# Patient Record
Sex: Male | Born: 1960 | Race: White | Hispanic: No | Marital: Married | Smoking: Never smoker
Health system: Southern US, Community
[De-identification: ages and names within clinical notes are randomized; demographics above are authoritative.]

## PROBLEM LIST (undated history)

## (undated) DIAGNOSIS — I10 Essential (primary) hypertension: Secondary | ICD-10-CM

## (undated) DIAGNOSIS — I639 Cerebral infarction, unspecified: Secondary | ICD-10-CM

## (undated) HISTORY — PX: HERNIA REPAIR: SHX51

## (undated) HISTORY — PX: OTHER SURGICAL HISTORY: SHX169

---

## 2010-03-21 ENCOUNTER — Ambulatory Visit: Payer: Self-pay | Admitting: Surgery

## 2010-03-27 ENCOUNTER — Ambulatory Visit: Payer: Self-pay | Admitting: Surgery

## 2017-03-08 ENCOUNTER — Other Ambulatory Visit: Payer: Self-pay | Admitting: Internal Medicine

## 2017-03-08 DIAGNOSIS — S060X0A Concussion without loss of consciousness, initial encounter: Secondary | ICD-10-CM

## 2017-03-09 ENCOUNTER — Other Ambulatory Visit: Payer: Self-pay | Admitting: Internal Medicine

## 2017-03-09 DIAGNOSIS — S060X0A Concussion without loss of consciousness, initial encounter: Secondary | ICD-10-CM

## 2017-03-10 ENCOUNTER — Other Ambulatory Visit: Payer: Self-pay | Admitting: Internal Medicine

## 2017-03-10 DIAGNOSIS — M25512 Pain in left shoulder: Secondary | ICD-10-CM

## 2017-03-10 DIAGNOSIS — S060X0A Concussion without loss of consciousness, initial encounter: Secondary | ICD-10-CM

## 2017-03-10 DIAGNOSIS — M542 Cervicalgia: Secondary | ICD-10-CM

## 2017-03-10 DIAGNOSIS — M25511 Pain in right shoulder: Secondary | ICD-10-CM

## 2017-03-11 ENCOUNTER — Ambulatory Visit: Payer: BLUE CROSS/BLUE SHIELD

## 2017-03-18 ENCOUNTER — Ambulatory Visit
Admission: RE | Admit: 2017-03-18 | Discharge: 2017-03-18 | Disposition: A | Discharge status: Home / Self Care | Payer: BLUE CROSS/BLUE SHIELD | Source: Ambulatory Visit | Attending: Internal Medicine | Admitting: Internal Medicine

## 2017-03-18 DIAGNOSIS — S060X0A Concussion without loss of consciousness, initial encounter: Secondary | ICD-10-CM | POA: Diagnosis not present

## 2017-03-18 DIAGNOSIS — X58XXXA Exposure to other specified factors, initial encounter: Secondary | ICD-10-CM | POA: Insufficient documentation

## 2019-08-30 ENCOUNTER — Other Ambulatory Visit: Payer: Self-pay

## 2019-08-30 ENCOUNTER — Encounter: Payer: Self-pay | Admitting: Emergency Medicine

## 2019-08-30 ENCOUNTER — Emergency Department
Admission: EM | Admit: 2019-08-30 | Discharge: 2019-08-30 | Disposition: A | Discharge status: Home / Self Care | Payer: 59 | Attending: Emergency Medicine | Admitting: Emergency Medicine

## 2019-08-30 ENCOUNTER — Emergency Department: Payer: 59

## 2019-08-30 DIAGNOSIS — K5792 Diverticulitis of intestine, part unspecified, without perforation or abscess without bleeding: Secondary | ICD-10-CM | POA: Insufficient documentation

## 2019-08-30 DIAGNOSIS — R109 Unspecified abdominal pain: Secondary | ICD-10-CM | POA: Diagnosis present

## 2019-08-30 LAB — COMPREHENSIVE METABOLIC PANEL
ALT: 20 U/L (ref 0–44)
AST: 18 U/L (ref 15–41)
Albumin: 4.2 g/dL (ref 3.5–5.0)
Alkaline Phosphatase: 65 U/L (ref 38–126)
Anion gap: 11 (ref 5–15)
BUN: 18 mg/dL (ref 6–20)
CO2: 25 mmol/L (ref 22–32)
Calcium: 9.1 mg/dL (ref 8.9–10.3)
Chloride: 101 mmol/L (ref 98–111)
Creatinine, Ser: 1.16 mg/dL (ref 0.61–1.24)
GFR calc Af Amer: >60 mL/min (ref >60)
GFR calc non Af Amer: >60 mL/min (ref >60)
Glucose, Bld: 119 mg/dL — ABNORMAL HIGH (ref 70–99)
Potassium: 3.9 mmol/L (ref 3.5–5.1)
Sodium: 137 mmol/L (ref 135–145)
Total Bilirubin: 1.1 mg/dL (ref 0.3–1.2)
Total Protein: 7.9 g/dL (ref 6.5–8.1)

## 2019-08-30 LAB — CBC WITH DIFFERENTIAL/PLATELET
Abs Immature Granulocytes: 0.06 10*3/uL (ref 0.00–0.07)
Basophils Absolute: 0 10*3/uL (ref 0.0–0.1)
Basophils Relative: 0 %
Eosinophils Absolute: 0 10*3/uL (ref 0.0–0.5)
Eosinophils Relative: 0 %
HCT: 44.2 % (ref 39.0–52.0)
Hemoglobin: 15.1 g/dL (ref 13.0–17.0)
Immature Granulocytes: 0 %
Lymphocytes Relative: 9 %
Lymphs Abs: 1.3 10*3/uL (ref 0.7–4.0)
MCH: 31.3 pg (ref 26.0–34.0)
MCHC: 34.2 g/dL (ref 30.0–36.0)
MCV: 91.5 fL (ref 80.0–100.0)
Monocytes Absolute: 1.4 10*3/uL — ABNORMAL HIGH (ref 0.1–1.0)
Monocytes Relative: 10 %
Neutro Abs: 11 10*3/uL — ABNORMAL HIGH (ref 1.7–7.7)
Neutrophils Relative %: 81 %
Platelets: 244 10*3/uL (ref 150–400)
RBC: 4.83 MIL/uL (ref 4.22–5.81)
RDW: 11.6 % (ref 11.5–15.5)
WBC: 13.8 10*3/uL — ABNORMAL HIGH (ref 4.0–10.5)
nRBC: 0 % (ref 0.0–0.2)

## 2019-08-30 LAB — URINALYSIS, COMPLETE (UACMP) WITH MICROSCOPIC
Bacteria, UA: NONE SEEN
Bilirubin Urine: NEGATIVE
Glucose, UA: NEGATIVE mg/dL
Hgb urine dipstick: NEGATIVE
Ketones, ur: NEGATIVE mg/dL
Leukocytes,Ua: NEGATIVE
Nitrite: NEGATIVE
Protein, ur: NEGATIVE mg/dL
Specific Gravity, Urine: 1.018 (ref 1.005–1.030)
Squamous Epithelial / HPF: NONE SEEN (ref 0–5)
pH: 5 (ref 5.0–8.0)

## 2019-08-30 LAB — LIPASE, BLOOD: Lipase: 51 U/L (ref 11–51)

## 2019-08-30 MED ORDER — PIPERACILLIN-TAZOBACTAM 3.375 G IVPB 30 MIN
3.3750 g | Freq: Once | INTRAVENOUS | Status: AC
  Administered 2019-08-30: 3.375 g via INTRAVENOUS
  Filled 2019-08-30: qty 50

## 2019-08-30 MED ORDER — MORPHINE SULFATE (PF) 4 MG/ML IV SOLN
4.0000 mg | Freq: Once | INTRAVENOUS | Status: AC
  Administered 2019-08-30: 4 mg via INTRAVENOUS
  Filled 2019-08-30: qty 1

## 2019-08-30 MED ORDER — CIPROFLOXACIN HCL 500 MG PO TABS: 500.0000 mg | ORAL_TABLET | Freq: Two times a day (BID) | ORAL | 0 refills | Status: AC

## 2019-08-30 MED ORDER — OXYCODONE-ACETAMINOPHEN 5-325 MG PO TABS: 1.0000 | ORAL_TABLET | Freq: Two times a day (BID) | ORAL | 0 refills | Status: DC | PRN

## 2019-08-30 MED ORDER — METRONIDAZOLE 500 MG PO TABS: 500.0000 mg | ORAL_TABLET | Freq: Three times a day (TID) | ORAL | 0 refills | Status: AC

## 2019-08-30 NOTE — ED Provider Notes (Signed)
[facility] Emergency Department Provider Note       Time seen: ----------------------------------------- 5:12 PM on 08/30/2019 -----------------------------------------  I have reviewed the triage vital signs and the nursing notes.  HISTORY   Chief Complaint No chief complaint on file.   HPI Eric Austin is a 59 y.o. male with no known past medical history who presents to the ED for severe right flank pain.  Patient describes it as dull, has not had vomiting or diarrhea.  Patient reports no history of kidney stones, reports he only has one kidney on the right side.  He was chopping wood recently but does not feel like he was overexerting himself.  No past medical history on file.  There are no problems to display for this patient.  Allergies Patient has no allergy information on record.  Social History Social History   Tobacco Use  . Smoking status: Not on file  Substance Use Topics  . Alcohol use: Not on file  . Drug use: Not on file    Review of Systems Constitutional: Negative for fever. Cardiovascular: Negative for chest pain. Respiratory: Negative for shortness of breath. Gastrointestinal: Positive for flank pain Musculoskeletal: Negative for back pain. Skin: Negative for rash. Neurological: Negative for headaches, focal weakness or numbness.  All systems negative/normal/unremarkable except as stated in the HPI  ____________________________________________   PHYSICAL EXAM:  VITAL SIGNS: ED Triage Vitals  Enc Vitals Group     BP      Pulse      Resp      Temp      Temp src      SpO2      Weight      Height      Head Circumference      Peak Flow      Pain Score      Pain Loc      Pain Edu?      Excl. in [place]?     Constitutional: Alert and oriented. Well appearing and in no distress. Eyes: Conjunctivae are normal. Normal extraocular movements. Cardiovascular: Normal rate, regular rhythm. No murmurs, rubs, or  gallops. Respiratory: Normal respiratory effort without tachypnea nor retractions. Breath sounds are clear and equal bilaterally. No wheezes/rales/rhonchi. Gastrointestinal: Right flank tenderness, no rebound or guarding.  Normal bowel sounds. Musculoskeletal: Nontender with normal range of motion in extremities. No lower extremity tenderness nor edema. Neurologic:  Normal speech and language. No gross focal neurologic deficits are appreciated.  Skin:  Skin is warm, dry and intact. No rash noted. Psychiatric: Mood and affect are normal. Speech and behavior are normal.  ____________________________________________  ED COURSE:  As part of my medical decision making, I reviewed the following data within the [facility] [place] History obtained from family if available, nursing notes, old chart and ekg, as well as notes from prior ED visits. Patient presented for flank pain, we will assess with labs and imaging as indicated at this time.   Procedures  Eric Austin was evaluated in Emergency Department on 08/30/2019 for the symptoms described in the history of present illness. He was evaluated in the context of the global COVID-19 pandemic, which necessitated consideration that the patient might be at risk for infection with the SARS-CoV-2 virus that causes COVID-19. Institutional protocols and algorithms that pertain to the evaluation of patients at risk for COVID-19 are in a state of rapid change based on information released by regulatory bodies including the CDC and federal and state  organizations. These policies and algorithms were followed during the patient's care in the ED.  ____________________________________________   LABS (pertinent positives/negatives)  Labs Reviewed  CBC WITH DIFFERENTIAL/PLATELET - Abnormal; Notable for the following components:      Result Value   WBC 13.8 (*)    Neutro Abs 11.0 (*)    Monocytes Absolute 1.4 (*)    All other components within normal  limits  COMPREHENSIVE METABOLIC PANEL - Abnormal; Notable for the following components:   Glucose, Bld 119 (*)    All other components within normal limits  URINALYSIS, COMPLETE (UACMP) WITH MICROSCOPIC - Abnormal; Notable for the following components:   Color, Urine YELLOW (*)    APPearance CLEAR (*)    All other components within normal limits  LIPASE, BLOOD    RADIOLOGY Images were viewed by me  CT renal protocol  IMPRESSION:  1. Negative for right hydronephrosis or ureteral stone.  2. Focal wall thickening with moderate inflammatory changes and  small amount of fluid at the ascending colon. Diverticular disease  is present and findings are suspected to represent acute right-sided  diverticulitis. No perforation or abscess. Appendix is normal.  3. 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  4. 3 mm left upper lobe pulmonary nodule. No follow-up needed if  patient is low-risk. Non-contrast chest CT can be considered in 12  months if patient is high-risk. This recommendation follows the  consensus statement: Guidelines for Management of Incidental  Pulmonary Nodules Detected on CT Images: From the Fleischner Society  2017; Radiology 2017; 284:228-243.  ____________________________________________   DIFFERENTIAL DIAGNOSIS   Renal colic, UTI, pyelonephritis, muscle strain, rib fracture  FINAL ASSESSMENT AND PLAN  Flank pain, acute ascending: Diverticulitis   Plan: The patient had presented for right flank pain. Patient's labs did reveal leukocytosis without other process. Patient's imaging revealed a surprising amount of inflammatory changes in the ascending colon consistent with diverticulitis.  There is no obvious perforation or abscess.  He received IV Zosyn and morphine for pain.  He will be discharged with antibiotics, pain medicine and close outpatient follow-up with his doctor.   Laurence Aly, MD    Note: This note was generated in part or  whole with voice recognition software. Voice recognition is usually quite accurate but there are transcription errors that can and very often do occur. I apologize for any typographical errors that were not detected and corrected.     Earleen Newport, MD 08/30/19 1924

## 2019-08-30 NOTE — ED Triage Notes (Signed)
Pt arrives from home via [facility] c/o rt flank abdominal pain that started last night. Pt was chopping wood today and the pain got so bad wife called EMS.

## 2020-05-14 ENCOUNTER — Other Ambulatory Visit: Payer: Self-pay

## 2020-05-14 ENCOUNTER — Ambulatory Visit (INDEPENDENT_AMBULATORY_CARE_PROVIDER_SITE_OTHER): Payer: 59 | Admitting: Internal Medicine

## 2020-05-14 DIAGNOSIS — Z0000 Encounter for general adult medical examination without abnormal findings: Secondary | ICD-10-CM

## 2020-05-15 ENCOUNTER — Ambulatory Visit (INDEPENDENT_AMBULATORY_CARE_PROVIDER_SITE_OTHER): Payer: 59 | Admitting: Internal Medicine

## 2020-05-15 ENCOUNTER — Encounter: Payer: Self-pay | Admitting: Internal Medicine

## 2020-05-15 ENCOUNTER — Other Ambulatory Visit: Payer: Self-pay

## 2020-05-15 VITALS — BP 134/81 | HR 82 | Ht 66.0 in | Wt 169.1 lb

## 2020-05-15 DIAGNOSIS — Z0000 Encounter for general adult medical examination without abnormal findings: Secondary | ICD-10-CM | POA: Diagnosis not present

## 2020-05-15 DIAGNOSIS — N400 Enlarged prostate without lower urinary tract symptoms: Secondary | ICD-10-CM | POA: Diagnosis not present

## 2020-05-15 DIAGNOSIS — K580 Irritable bowel syndrome with diarrhea: Secondary | ICD-10-CM | POA: Insufficient documentation

## 2020-05-15 DIAGNOSIS — M1711 Unilateral primary osteoarthritis, right knee: Secondary | ICD-10-CM

## 2020-05-15 LAB — LIPID PANEL
Cholesterol: 220 mg/dL — ABNORMAL HIGH (ref <200)
HDL: 39 mg/dL — ABNORMAL LOW (ref >=40)
LDL Cholesterol (Calc): 150 mg/dL (calc) — ABNORMAL HIGH
Non-HDL Cholesterol (Calc): 181 mg/dL (calc) — ABNORMAL HIGH (ref <130)
Total CHOL/HDL Ratio: 5.6 (calc) — ABNORMAL HIGH (ref <5.0)
Triglycerides: 172 mg/dL — ABNORMAL HIGH (ref <150)

## 2020-05-15 LAB — COMPLETE METABOLIC PANEL WITH GFR
AG Ratio: 1.5 (calc) (ref 1.0–2.5)
ALT: 39 U/L (ref 9–46)
AST: 32 U/L (ref 10–35)
Albumin: 4.1 g/dL (ref 3.6–5.1)
Alkaline phosphatase (APISO): 70 U/L (ref 35–144)
BUN: 12 mg/dL (ref 7–25)
CO2: 24 mmol/L (ref 20–32)
Calcium: 9.3 mg/dL (ref 8.6–10.3)
Chloride: 105 mmol/L (ref 98–110)
Creat: 1.28 mg/dL (ref 0.70–1.33)
GFR, Est African American: 71 mL/min/{1.73_m2} (ref >=60)
GFR, Est Non African American: 61 mL/min/{1.73_m2} (ref >=60)
Globulin: 2.8 g/dL (calc) (ref 1.9–3.7)
Glucose, Bld: 98 mg/dL (ref 65–99)
Potassium: 3.8 mmol/L (ref 3.5–5.3)
Sodium: 140 mmol/L (ref 135–146)
Total Bilirubin: 0.9 mg/dL (ref 0.2–1.2)
Total Protein: 6.9 g/dL (ref 6.1–8.1)

## 2020-05-15 LAB — CBC WITH DIFFERENTIAL/PLATELET
Absolute Monocytes: 560 cells/uL (ref 200–950)
Basophils Absolute: 32 cells/uL (ref 0–200)
Basophils Relative: 0.8 %
Eosinophils Absolute: 32 cells/uL (ref 15–500)
Eosinophils Relative: 0.8 %
HCT: 42.5 % (ref 38.5–50.0)
Hemoglobin: 14.9 g/dL (ref 13.2–17.1)
Lymphs Abs: 1552 cells/uL (ref 850–3900)
MCH: 32.3 pg (ref 27.0–33.0)
MCHC: 35.1 g/dL (ref 32.0–36.0)
MCV: 92 fL (ref 80.0–100.0)
MPV: 10.9 fL (ref 7.5–12.5)
Monocytes Relative: 14 %
Neutro Abs: 1824 cells/uL (ref 1500–7800)
Neutrophils Relative %: 45.6 %
Platelets: 218 10*3/uL (ref 140–400)
RBC: 4.62 10*6/uL (ref 4.20–5.80)
RDW: 11.4 % (ref 11.0–15.0)
Total Lymphocyte: 38.8 %
WBC: 4 10*3/uL (ref 3.8–10.8)

## 2020-05-15 LAB — PSA: PSA: 1.09 ng/mL (ref <=4.0)

## 2020-05-15 LAB — TSH: TSH: 2.45 mIU/L (ref 0.40–4.50)

## 2020-05-15 NOTE — Progress Notes (Signed)
Established Patient Office Visit  Subjective:  Patient ID: Eric Austin, male    DOB: 16-Dec-1960  Age: 59 y.o. MRN: [id number]  CC:  Chief Complaint  Patient presents with  . Annual Exam    Diarrhea  This is a chronic problem. The current episode started more than 1 year ago. The problem has been waxing and waning. The stool consistency is described as watery. Associated symptoms include abdominal pain, arthralgias and sweats. Pertinent negatives include no bloating, chills, coughing, fever, headaches, increased  flatus, myalgias, URI, vomiting or weight loss. The symptoms are aggravated by dairy products and stress. He has tried nothing for the symptoms. His past medical history is significant for irritable bowel syndrome. There is no history of bowel resection, inflammatory bowel disease, a recent abdominal surgery or short gut syndrome.  Knee Pain  Pertinent negatives include no numbness.  Complain of pain in the right knee for the last 1 to 5 months.  No history of any injury.  [name] presents for physical     History reviewed. No pertinent past medical history.  History reviewed. No pertinent surgical history.  History reviewed. No pertinent family history.  Social History   Socioeconomic History  . Marital status: Married    Spouse name: Not on file  . Number of children: Not on file  . Years of education: Not on file  . Highest education level: Not on file  Occupational History  . Not on file  Tobacco Use  . Smoking status: Never Smoker  . Smokeless tobacco: Never Used  Vaping Use  . Vaping Use: Never used  Substance and Sexual Activity  . Alcohol use: Yes    Comment: 11 drinks per week   . Drug use: Never  . Sexual activity: Not on file  Other Topics Concern  . Not on file  Social History Narrative  . Not on file   Social Determinants of Health   Financial Resource Strain:   . Difficulty of Paying Living Expenses: Not on file  Food Insecurity:     . Worried About Charity fundraiser in the Last Year: Not on file  . Ran Out of Food in the Last Year: Not on file  Transportation Needs:   . Lack of Transportation (Medical): Not on file  . Lack of Transportation (Non-Medical): Not on file  Physical Activity:   . Days of Exercise per Week: Not on file  . Minutes of Exercise per Session: Not on file  Stress:   . Feeling of Stress : Not on file  Social Connections:   . Frequency of Communication with Friends and Family: Not on file  . Frequency of Social Gatherings with Friends and Family: Not on file  . Attends Religious Services: Not on file  . Active Member of Clubs or Organizations: Not on file  . Attends Archivist Meetings: Not on file  . Marital Status: Not on file  Intimate Partner Violence:   . Fear of Current or Ex-Partner: Not on file  . Emotionally Abused: Not on file  . Physically Abused: Not on file  . Sexually Abused: Not on file    No current outpatient medications on file.   Not on File  ROS Review of Systems  Constitutional: Negative.  Negative for chills, fever and weight loss.  HENT: Negative.   Eyes: Negative.  Negative for discharge.  Respiratory: Negative.  Negative for cough.   Cardiovascular: Negative.   Gastrointestinal: Positive for  abdominal pain and diarrhea. Negative for bloating, flatus and vomiting.  Endocrine: Negative.  Negative for polyphagia and polyuria.  Genitourinary: Negative.  Negative for frequency and hematuria.  Musculoskeletal: Positive for arthralgias. Negative for myalgias.  Skin: Negative.   Allergic/Immunologic: Negative.   Neurological: Negative.  Negative for dizziness, numbness and headaches.  Hematological: Negative.  Negative for adenopathy.  Psychiatric/Behavioral: Negative.  Negative for agitation. The patient is not hyperactive.   All other systems reviewed and are negative.     Objective:    Physical Exam Vitals reviewed.  Constitutional:       Appearance: Normal appearance.  HENT:     Mouth/Throat:     Mouth: Mucous membranes are moist.  Eyes:     Pupils: Pupils are equal, round, and reactive to light.  Neck:     Vascular: No carotid bruit.  Cardiovascular:     Rate and Rhythm: Normal rate and regular rhythm.     Pulses: Normal pulses.     Heart sounds: Normal heart sounds.  Pulmonary:     Effort: Pulmonary effort is normal.     Breath sounds: Normal breath sounds.  Abdominal:     General: Bowel sounds are normal.     Palpations: Abdomen is soft. There is no hepatomegaly, splenomegaly or mass.     Tenderness: There is no abdominal tenderness.     Hernia: No hernia is present.  Genitourinary:    Prostate: Normal.     Rectum: Normal. Guaiac result negative.  Musculoskeletal:        General: No swelling.     Cervical back: Neck supple.     Right lower leg: No edema.     Left lower leg: No edema.  Skin:    Findings: No rash.  Neurological:     Mental Status: He is alert and oriented to person, place, and time.     Motor: No weakness.  Psychiatric:        Mood and Affect: Mood normal.        Behavior: Behavior normal.     BP 134/81   Pulse 82   Ht 5\' 6"  (1.676 m)   Wt 169 lb 1.6 oz (76.7 kg)   BMI 27.29 kg/m  Wt Readings from Last 3 Encounters:  05/15/20 169 lb 1.6 oz (76.7 kg)  08/30/19 165 lb (74.8 kg)     Health Maintenance Due  Topic Date Due  . Hepatitis C Screening  Never done  . HIV Screening  Never done  . TETANUS/TDAP  Never done  . COLONOSCOPY  Never done  . INFLUENZA VACCINE  Never done    There are no preventive care reminders to display for this patient.  Lab Results  Component Value Date   TSH 2.45 05/14/2020   Lab Results  Component Value Date   WBC 4.0 05/14/2020   HGB 14.9 05/14/2020   HCT 42.5 05/14/2020   MCV 92.0 05/14/2020   PLT 218 05/14/2020   Lab Results  Component Value Date   NA 140 05/14/2020   K 3.8 05/14/2020   CO2 24 05/14/2020   GLUCOSE 98 05/14/2020     BUN 12 05/14/2020   CREATININE 1.28 05/14/2020   BILITOT 0.9 05/14/2020   ALKPHOS 65 08/30/2019   AST 32 05/14/2020   ALT 39 05/14/2020   PROT 6.9 05/14/2020   ALBUMIN 4.2 08/30/2019   CALCIUM 9.3 05/14/2020   ANIONGAP 11 08/30/2019   Lab Results  Component Value Date   CHOL 220 (  H) 05/14/2020   Lab Results  Component Value Date   HDL 39 (L) 05/14/2020   Lab Results  Component Value Date   LDLCALC 150 (H) 05/14/2020   Lab Results  Component Value Date   TRIG 172 (H) 05/14/2020   Lab Results  Component Value Date   CHOLHDL 5.6 (H) 05/14/2020   No results found for: HGBA1C    Assessment & Plan:   Problem List Items Addressed This Visit      Digestive   Irritable bowel syndrome with diarrhea    Patient complaining of intermittent diarrhea.  I did a rectal examination which was unremarkable with no blood in stool.  Prostate is borderline enlarged.  We will send the patient to gastroenterology he is due for colonoscopy.        Musculoskeletal and Integument   Primary osteoarthritis of right knee    Patient has primary osteoarthritis of the right knee.  We will get an x-ray.  If he needed a steroid shot he will come back after the x-ray was done.        Genitourinary   BPH without obstruction/lower urinary tract symptoms    Patient prostate is slightly enlarged by physical examination.  His PSA is normal.        Other   Annual physical exam - Primary    Patient general physical examination is normal.  He has high cholesterol and he was advised to follow low-cholesterol diet.  Blood pressure is stable.         No orders of the defined types were placed in this encounter.   Follow-up    Patient main problem on today's examination was found to have right knee pain I will get an x-ray of the right knee and give him a cortisone shot if needed.  I will send him to gastroenterologist for colonoscopy.  He will also look on the irritable bowel syndrome and  try to find out the etiology of the patient diarrhea. Patient cholesterol is elevated she was advised to follow low-cholesterol diet. Cletis Athens, MD

## 2020-05-15 NOTE — Assessment & Plan Note (Signed)
Patient general physical examination is normal.  He has high cholesterol and he was advised to follow low-cholesterol diet.  Blood pressure is stable.

## 2020-05-15 NOTE — Assessment & Plan Note (Signed)
Patient has primary osteoarthritis of the right knee.  We will get an x-ray.  If he needed a steroid shot he will come back after the x-ray was done.

## 2020-05-15 NOTE — Assessment & Plan Note (Signed)
Patient complaining of intermittent diarrhea.  I did a rectal examination which was unremarkable with no blood in stool.  Prostate is borderline enlarged.  We will send the patient to gastroenterology he is due for colonoscopy.

## 2020-05-15 NOTE — Assessment & Plan Note (Signed)
Patient prostate is slightly enlarged by physical examination.  His PSA is normal.

## 2020-05-20 NOTE — Addendum Note (Signed)
Addended by: Alois Cliche on: 05/20/2020 03:26 PM   Modules accepted: Orders

## 2020-06-11 ENCOUNTER — Encounter: Payer: Self-pay | Admitting: Gastroenterology

## 2020-06-11 ENCOUNTER — Other Ambulatory Visit: Payer: Self-pay

## 2020-06-11 ENCOUNTER — Ambulatory Visit (INDEPENDENT_AMBULATORY_CARE_PROVIDER_SITE_OTHER): Payer: 59 | Admitting: Gastroenterology

## 2020-06-11 VITALS — BP 130/79 | HR 95 | Temp 98.4°F | Ht 68.0 in | Wt 168.0 lb

## 2020-06-11 DIAGNOSIS — Z8719 Personal history of other diseases of the digestive system: Secondary | ICD-10-CM | POA: Insufficient documentation

## 2020-06-11 MED ORDER — NA SULFATE-K SULFATE-MG SULF 17.5-3.13-1.6 GM/177ML PO SOLN: 1.0000 | Freq: Once | ORAL | 0 refills | Status: AC

## 2020-06-11 NOTE — Progress Notes (Signed)
Eric Bellows MD, MRCP(U.K) [address]  [place]  [place], [place] [address]  Main: [phone]  Fax: [phone]   Gastroenterology Consultation  Referring Provider:     Cletis Athens, MD Primary Care Physician:  Cletis Athens, MD Primary Gastroenterologist:  Dr. Jonathon Austin  Reason for Consultation:   IBS diarrhea        HPI:   Eric Austin is a 59 y.o. y/o male referred for consultation & management  by Dr. Cletis Athens, MD.     He has been referred for IBS diarrhea and colonoscopy as he is due.  Seen in the emergency room in February 2021 for right flank pain CT scan renal stone protocol demonstrated focal wall thickening with moderate inflammatory changes and small amount of fluid in the ascending colon diverticular disease is present and suspected to represent acute right-sided diverticulitis.  05/14/2020: Hemoglobin 14.9 g, CMP normal, TSH normal Denies any diarrhea presently few months back did have some diarrhea.  Last colonoscopy many years back.  No family history of colon cancer or polyps.  No GI symptoms presently.  Previously was consuming a couple of glasses of sweet tea a day. No past medical history on file.  No past surgical history on file.  Prior to Admission medications   Not on File    No family history on file.   Social History   Tobacco Use  . Smoking status: Never Smoker  . Smokeless tobacco: Never Used  Vaping Use  . Vaping Use: Never used  Substance Use Topics  . Alcohol use: Yes    Comment: 11 drinks per week   . Drug use: Never    Allergies as of 06/11/2020  . (No Known Allergies)    Review of Systems:    All systems reviewed and negative except where noted in HPI.   Physical Exam:  BP 130/79   Pulse 95   Temp 98.4 F (36.9 C) (Oral)   Ht 5\' 8"  (1.727 m)   Wt 168 lb (76.2 kg)   BMI 25.54 kg/m  No LMP for male patient. Psych:  Alert and cooperative. Normal mood and affect. General:   Alert,  Well-developed,  well-nourished, pleasant and cooperative in NAD Head:  Normocephalic and atraumatic. Eyes:  Sclera clear, no icterus.   Conjunctiva pink. Ears:  Normal auditory acuity. Lungs:  Respirations even and unlabored.  Clear throughout to auscultation.   No wheezes, crackles, or rhonchi. No acute distress. Heart:  Regular rate and rhythm; no murmurs, clicks, rubs, or gallops. Abdomen:  Normal bowel sounds.  No bruits.  Soft, non-tender and non-distended without masses, hepatosplenomegaly or hernias noted.  No guarding or rebound tenderness.    Neurologic:  Alert and oriented x3;  grossly normal neurologically. Psych:  Alert and cooperative. Normal mood and affect.  Imaging Studies: No results found.  Assessment and Plan:   Eric Austin is a 59 y.o. y/o male with recent history of right-sided diverticulitis in February 2021 has been referred for IBS diarrhea and colonoscopy.  Likely effect of artificial sugars and high fructose corn syrup causing some osmotic diarrhea.  Due to recent episode of diverticulitis it would warrant a colonoscopy.  Plan 1.  Diagnostic colonoscopy 2.  Since has no GI symptoms presently advised to watch his diet and maintain a diary.  If diarrhea returns to call my office to discuss further.  I have discussed alternative options, risks & benefits,  which include, but are not limited to, bleeding,  infection, perforation,respiratory complication & drug reaction.  The patient agrees with this plan & written consent will be obtained.     Follow up in as needed  Dr Eric Bellows MD,MRCP(U.K)

## 2020-07-23 ENCOUNTER — Other Ambulatory Visit
Admission: RE | Admit: 2020-07-23 | Discharge: 2020-07-23 | Disposition: A | Discharge status: Home / Self Care | Payer: 59 | Source: Ambulatory Visit | Attending: Gastroenterology | Admitting: Gastroenterology

## 2020-07-23 ENCOUNTER — Other Ambulatory Visit: Payer: Self-pay

## 2020-07-23 DIAGNOSIS — Z20822 Contact with and (suspected) exposure to covid-19: Secondary | ICD-10-CM | POA: Insufficient documentation

## 2020-07-23 DIAGNOSIS — Z01812 Encounter for preprocedural laboratory examination: Secondary | ICD-10-CM | POA: Insufficient documentation

## 2020-07-23 LAB — SARS CORONAVIRUS 2 (TAT 6-24 HRS): SARS Coronavirus 2: NEGATIVE

## 2020-07-24 ENCOUNTER — Encounter: Payer: Self-pay | Admitting: Gastroenterology

## 2020-07-25 ENCOUNTER — Ambulatory Visit: Payer: 59 | Admitting: Certified Registered Nurse Anesthetist

## 2020-07-25 ENCOUNTER — Other Ambulatory Visit: Payer: Self-pay

## 2020-07-25 ENCOUNTER — Encounter: Payer: Self-pay | Admitting: Gastroenterology

## 2020-07-25 ENCOUNTER — Ambulatory Visit
Admission: RE | Admit: 2020-07-25 | Discharge: 2020-07-25 | Disposition: A | Discharge status: Home / Self Care | Payer: 59 | Attending: Gastroenterology | Admitting: Gastroenterology

## 2020-07-25 ENCOUNTER — Encounter
Admission: RE | Disposition: A | Discharge status: Home / Self Care | Payer: Self-pay | Source: Home / Self Care | Attending: Gastroenterology

## 2020-07-25 DIAGNOSIS — D125 Benign neoplasm of sigmoid colon: Secondary | ICD-10-CM | POA: Insufficient documentation

## 2020-07-25 DIAGNOSIS — Z8719 Personal history of other diseases of the digestive system: Secondary | ICD-10-CM

## 2020-07-25 DIAGNOSIS — Z09 Encounter for follow-up examination after completed treatment for conditions other than malignant neoplasm: Secondary | ICD-10-CM | POA: Insufficient documentation

## 2020-07-25 DIAGNOSIS — K5790 Diverticulosis of intestine, part unspecified, without perforation or abscess without bleeding: Secondary | ICD-10-CM | POA: Diagnosis not present

## 2020-07-25 DIAGNOSIS — K635 Polyp of colon: Secondary | ICD-10-CM | POA: Diagnosis not present

## 2020-07-25 HISTORY — PX: COLONOSCOPY WITH PROPOFOL: SHX5780

## 2020-07-25 SURGERY — COLONOSCOPY WITH PROPOFOL
Anesthesia: General

## 2020-07-25 MED ORDER — LIDOCAINE HCL (CARDIAC) PF 100 MG/5ML IV SOSY
PREFILLED_SYRINGE | INTRAVENOUS | Status: DC | PRN
  Administered 2020-07-25: 80 mg via INTRAVENOUS

## 2020-07-25 MED ORDER — PROPOFOL 500 MG/50ML IV EMUL
INTRAVENOUS | Status: AC
  Filled 2020-07-25: qty 100

## 2020-07-25 MED ORDER — PROPOFOL 10 MG/ML IV BOLUS
INTRAVENOUS | Status: AC
  Filled 2020-07-25: qty 40

## 2020-07-25 MED ORDER — PHENYLEPHRINE HCL (PRESSORS) 10 MG/ML IV SOLN
INTRAVENOUS | Status: DC | PRN
  Administered 2020-07-25: 50 ug via INTRAVENOUS

## 2020-07-25 MED ORDER — SODIUM CHLORIDE 0.9 % IV SOLN: INTRAVENOUS | Status: DC

## 2020-07-25 MED ORDER — PROPOFOL 500 MG/50ML IV EMUL
INTRAVENOUS | Status: DC | PRN
  Administered 2020-07-25: 140 ug/kg/min via INTRAVENOUS

## 2020-07-25 MED ORDER — PROPOFOL 10 MG/ML IV BOLUS
INTRAVENOUS | Status: DC | PRN
  Administered 2020-07-25: 10 mg via INTRAVENOUS
  Administered 2020-07-25: 50 mg via INTRAVENOUS
  Administered 2020-07-25 (×2): 20 mg via INTRAVENOUS

## 2020-07-25 NOTE — Anesthesia Preprocedure Evaluation (Signed)
Anesthesia Evaluation  Patient identified by MRN, date of birth, ID band Patient awake    Reviewed: Allergy & Precautions, H&P , NPO status , Patient's Chart, lab work & pertinent test results  Airway Mallampati: II       Dental no notable dental hx. (+) Teeth Intact   Pulmonary neg pulmonary ROS,    Pulmonary exam normal breath sounds clear to auscultation       Cardiovascular negative cardio ROS Normal cardiovascular exam Rhythm:Regular Rate:Normal     Neuro/Psych negative neurological ROS  negative psych ROS   GI/Hepatic negative GI ROS, Neg liver ROS,   Endo/Other  negative endocrine ROS  Renal/GU negative Renal ROS  negative genitourinary   Musculoskeletal  (+) Arthritis ,   Abdominal   Peds negative pediatric ROS (+)  Hematology negative hematology ROS (+)   Anesthesia Other Findings History reviewed. No pertinent past medical history.   Reproductive/Obstetrics negative OB ROS                             Anesthesia Physical Anesthesia Plan  ASA: II  Anesthesia Plan: General   Post-op Pain Management:    Induction: Intravenous  PONV Risk Score and Plan: 2 and Propofol infusion  Airway Management Planned: Nasal Cannula  Additional Equipment:   Intra-op Plan:   Post-operative Plan:   Informed Consent: I have reviewed the patients History and Physical, chart, labs and discussed the procedure including the risks, benefits and alternatives for the proposed anesthesia with the patient or authorized representative who has indicated his/her understanding and acceptance.       Plan Discussed with: CRNA, Anesthesiologist and Surgeon  Anesthesia Plan Comments:         Anesthesia Quick Evaluation

## 2020-07-25 NOTE — Transfer of Care (Signed)
Immediate Anesthesia Transfer of Care Note  Patient: Eric Austin  Procedure(s) Performed: COLONOSCOPY WITH PROPOFOL (N/A )  Patient Location: PACU and Endoscopy Unit  Anesthesia Type:General  Level of Consciousness: drowsy  Airway & Oxygen Therapy: Patient Spontanous Breathing  Post-op Assessment: Report given to RN and Post -op Vital signs reviewed and stable  Post vital signs: Reviewed and stable  Last Vitals:  Vitals Value Taken Time  BP 101/70   Temp    Pulse 66 07/25/20 0826  Resp 17 07/25/20 0826  SpO2 95 % 07/25/20 0826  Vitals shown include unvalidated device data.  Last Pain:  Vitals:   07/25/20 0712  TempSrc: Temporal  PainSc: 0-No pain         Complications: No complications documented.

## 2020-07-25 NOTE — Op Note (Signed)
[facility] Gastroenterology Patient Name: Eric KitchensJerry Austin Procedure Date: 07/25/2020 7:19 AM MRN: [id number] Account #: [id number] Date of Birth: 12/26/1960 Admit Type: Outpatient Age: 60 [facility] ROOM 3 Gender: Male Note Status: Finalized Procedure:             Colonoscopy Indications:           Follow-up of diverticulitis Providers:             Wyline MoodKiran Jerral Mccauley MD, MD Referring MD:          Corky DownsJaved Masoud, MD (Referring MD) Medicines:             Monitored Anesthesia Care Complications:         No immediate complications. Procedure:             Pre-Anesthesia Assessment:                        - Prior to the procedure, a History and Physical was                         performed, and patient medications, allergies and                         sensitivities were reviewed. The patient's tolerance                         of previous anesthesia was reviewed.                        - The risks and benefits of the procedure and the                         sedation options and risks were discussed with the                         patient. All questions were answered and informed                         consent was obtained.                        - ASA Grade Assessment: II - A patient with mild                         systemic disease.                        After obtaining informed consent, the colonoscope was                         passed under direct vision. Throughout the procedure,                         the patient's blood pressure, pulse, and oxygen                         saturations were monitored continuously. The                         Colonoscope was introduced through the anus and  advanced to the the cecum, identified by the                         appendiceal orifice. The colonoscopy was performed                         with ease. The patient tolerated the procedure well.                         The quality of the bowel preparation  was excellent. Findings:      The perianal and digital rectal examinations were normal.      Multiple small-mouthed diverticula were found in the entire colon.      A 8 mm polyp was found in the sigmoid colon. The polyp was sessile. The       polyp was removed with a cold snare. Resection and retrieval were       complete.      The exam was otherwise without abnormality on direct and retroflexion       views. Impression:            - Diverticulosis in the entire examined colon.                        - One 8 mm polyp in the sigmoid colon, removed with a                         cold snare. Resected and retrieved.                        - The examination was otherwise normal on direct and                         retroflexion views. Recommendation:        - Discharge patient to home (with escort).                        - Resume previous diet.                        - Continue present medications.                        - Await pathology results.                        - Repeat colonoscopy for surveillance based on                         pathology results. Procedure Code(s):     --- Professional ---                        [phone], Colonoscopy, flexible; with removal of                         tumor(s), polyp(s), or other lesion(s) by snare                         technique Diagnosis Code(s):     --- Professional ---  K63.5, Polyp of colon                        K57.32, Diverticulitis of large intestine without                         perforation or abscess without bleeding                        K57.30, Diverticulosis of large intestine without                         perforation or abscess without bleeding CPT copyright 2019 American Medical Association. All rights reserved. The codes documented in this report are preliminary and upon coder review may  be revised to meet current compliance requirements. Jonathon Bellows, MD Jonathon Bellows MD, MD 07/25/2020 8:25:34 AM This  report has been signed electronically. Number of Addenda: 0 Note Initiated On: 07/25/2020 7:19 AM Scope Withdrawal Time: 0 hours 14 minutes 3 seconds  Total Procedure Duration: 0 hours 17 minutes 17 seconds  Estimated Blood Loss:  Estimated blood loss: none.      [facility]

## 2020-07-25 NOTE — Anesthesia Postprocedure Evaluation (Signed)
Anesthesia Post Note  Patient: Eric Austin  Procedure(s) Performed: COLONOSCOPY WITH PROPOFOL (N/A )  Patient location during evaluation: Endoscopy Anesthesia Type: General Level of consciousness: awake Pain management: pain level controlled Vital Signs Assessment: post-procedure vital signs reviewed and stable Respiratory status: spontaneous breathing Cardiovascular status: blood pressure returned to baseline and stable Postop Assessment: no apparent nausea or vomiting Anesthetic complications: no   No complications documented.   Last Vitals:  Vitals:   07/25/20 0846 07/25/20 0855  BP: (!) 124/91 (!) 131/91  Pulse:    Resp:    Temp:    SpO2:      Last Pain:  Vitals:   07/25/20 0855  TempSrc:   PainSc: 0-No pain                 Neva Seat

## 2020-07-25 NOTE — Anesthesia Procedure Notes (Signed)
Procedure Name: MAC Date/Time: 07/25/2020 8:05 AM Performed by: Lily Peer, Dwight Burdo, CRNA Pre-anesthesia Checklist: Patient identified, Emergency Drugs available, Suction available, Patient being monitored and Timeout performed Oxygen Delivery Method: Nasal cannula Induction Type: IV induction

## 2020-07-25 NOTE — H&P (Signed)
     Jonathon Bellows, MD [address] [place], [place], [place], [address] [address], [place], [place], [place], [address] Phone: [phone]  Fax: [phone]  Primary Care Physician:  Cletis Athens, MD   Pre-Procedure History & Physical: HPI:  Eric Austin is a 60 y.o. male is here for an colonoscopy.   History reviewed. No pertinent past medical history.  Past Surgical History:  Procedure Laterality Date  . HERNIA REPAIR    . kidney removed      Prior to Admission medications   Not on File    Allergies as of 06/11/2020  . (No Known Allergies)    History reviewed. No pertinent family history.  Social History   Socioeconomic History  . Marital status: Married    Spouse name: Not on file  . Number of children: Not on file  . Years of education: Not on file  . Highest education level: Not on file  Occupational History  . Not on file  Tobacco Use  . Smoking status: Never Smoker  . Smokeless tobacco: Never Used  Vaping Use  . Vaping Use: Never used  Substance and Sexual Activity  . Alcohol use: Yes    Comment: 11 drinks per week   . Drug use: Never  . Sexual activity: Not on file  Other Topics Concern  . Not on file  Social History Narrative  . Not on file   Social Determinants of Health   Financial Resource Strain: Not on file  Food Insecurity: Not on file  Transportation Needs: Not on file  Physical Activity: Not on file  Stress: Not on file  Social Connections: Not on file  Intimate Partner Violence: Not on file    Review of Systems: See HPI, otherwise negative ROS  Physical Exam: BP 128/90   Pulse 88   Temp 97.7 F (36.5 C) (Temporal)   Resp 18   Ht 5\' 7"  (1.702 m)   Wt 76.2 kg   SpO2 98%   BMI 26.31 kg/m  General:   Alert,  pleasant and cooperative in NAD Head:  Normocephalic and atraumatic. Neck:  Supple; no masses or thyromegaly. Lungs:  Clear throughout to auscultation, normal respiratory effort.    Heart:  +S1, +S2,  Regular rate and rhythm, No edema. Abdomen:  Soft, nontender and nondistended. Normal bowel sounds, without guarding, and without rebound.   Neurologic:  Alert and  oriented x4;  grossly normal neurologically.  Impression/Plan: Eric Austin is here for an colonoscopy to be performed for diverticulitis. Risks, benefits, limitations, and alternatives regarding  colonoscopy have been reviewed with the patient.  Questions have been answered.  All parties agreeable.   Jonathon Bellows, MD  07/25/2020, 8:02 AM

## 2020-07-26 ENCOUNTER — Encounter: Payer: Self-pay | Admitting: Gastroenterology

## 2020-07-26 LAB — SURGICAL PATHOLOGY

## 2020-07-29 ENCOUNTER — Encounter: Payer: Self-pay | Admitting: Gastroenterology

## 2020-07-29 IMAGING — CT CT RENAL STONE PROTOCOL
2 of 4 series · 15 of 46 positions shown, 17 images · non-contrast
Comparison: None.

CLINICAL DATA: Flank pain right-sided

EXAM:
CT ABDOMEN AND PELVIS WITHOUT CONTRAST
TECHNIQUE: Multidetector CT imaging of the abdomen and pelvis was performed
following the standard protocol without IV contrast.

[Series 2: stone full standard · axial · 0.78mm/px · z∈[-468,-48]mm · 12 of 94 slices shown, 14 images]
[im 5/94  soft-tissue]
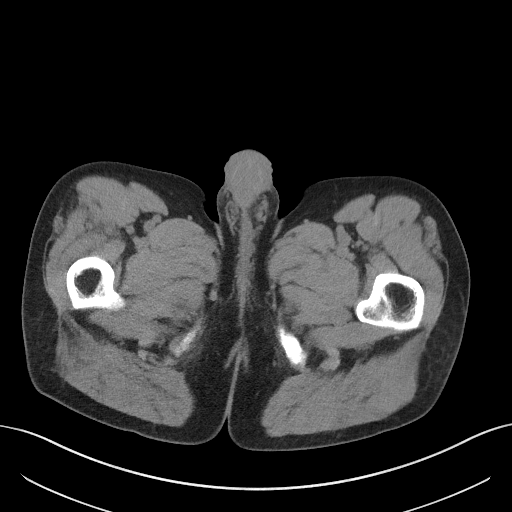
[im 5/94  bone]
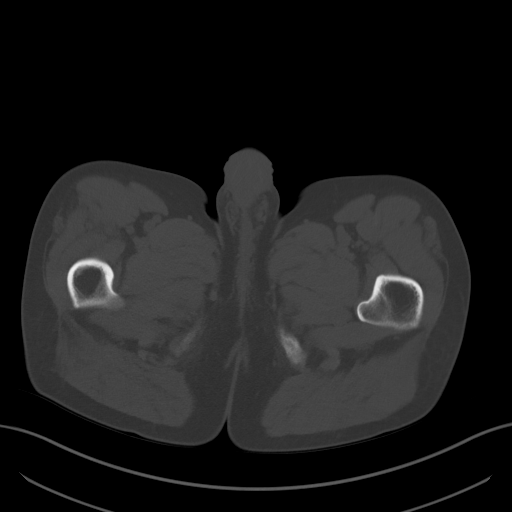
[im 13/94  soft-tissue]
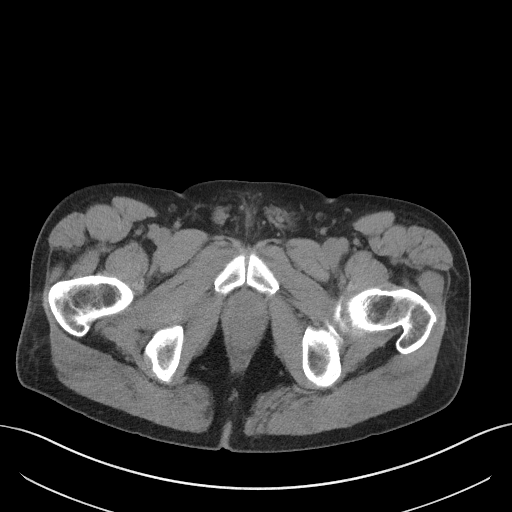
[im 21/94  soft-tissue]
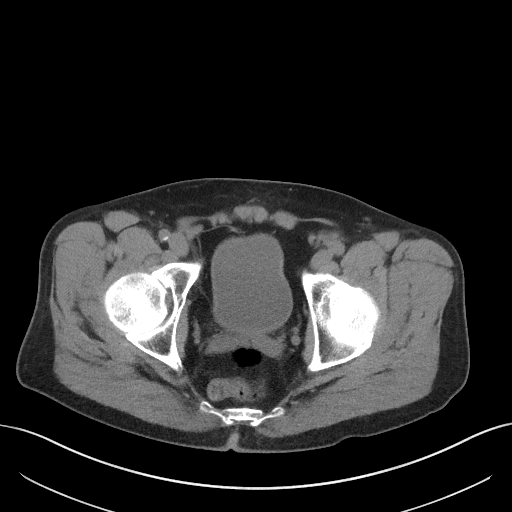
[im 29/94  soft-tissue]
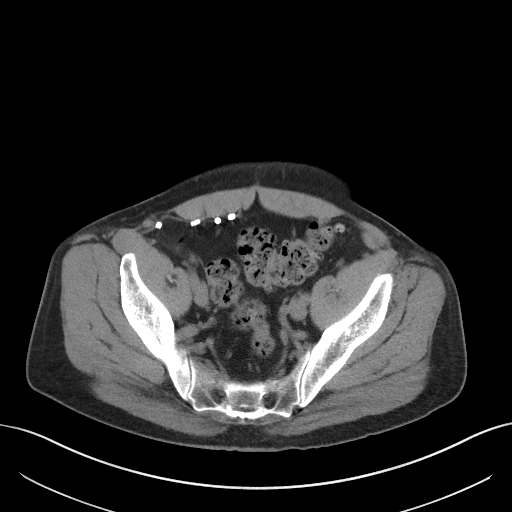
[im 37/94  soft-tissue]
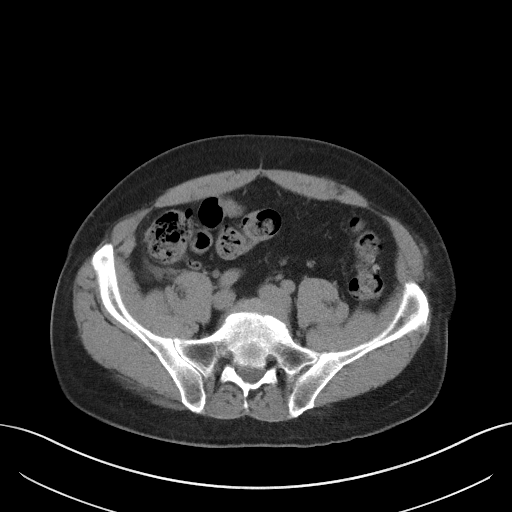
[im 45/94  soft-tissue]
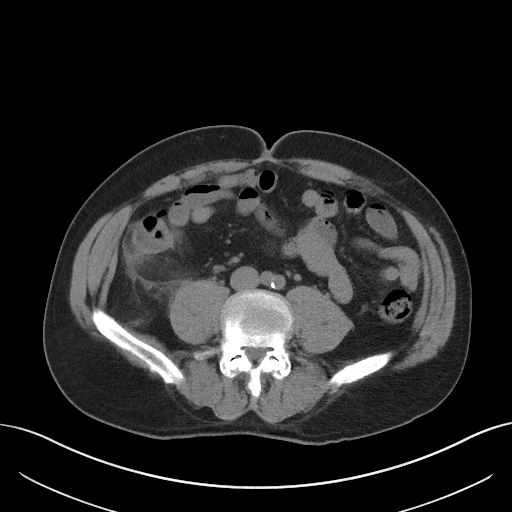
[im 49/94  soft-tissue]
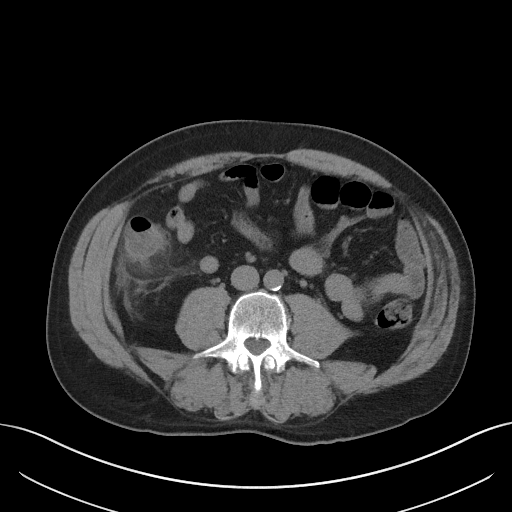
[im 57/94  soft-tissue]
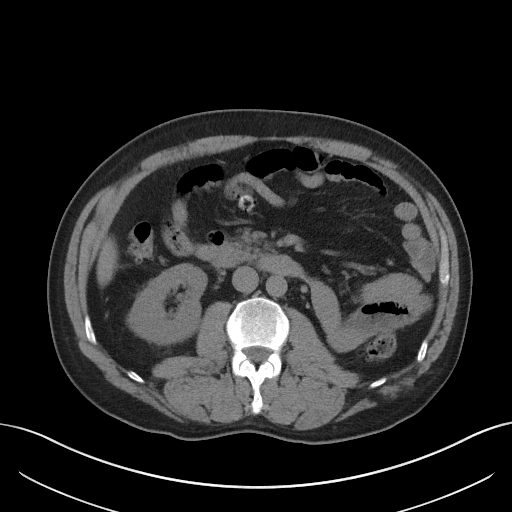
[im 65/94  soft-tissue]
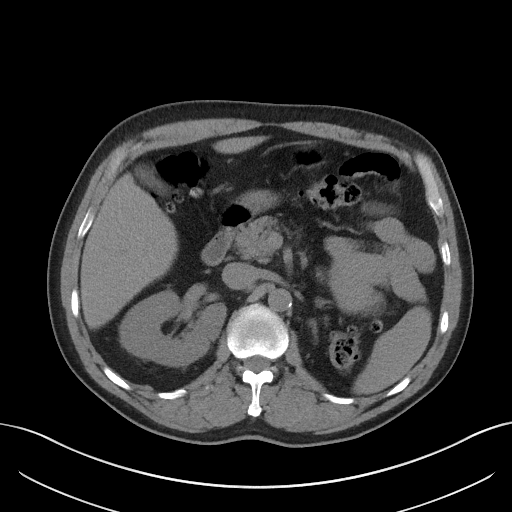
[im 65/94  bone]
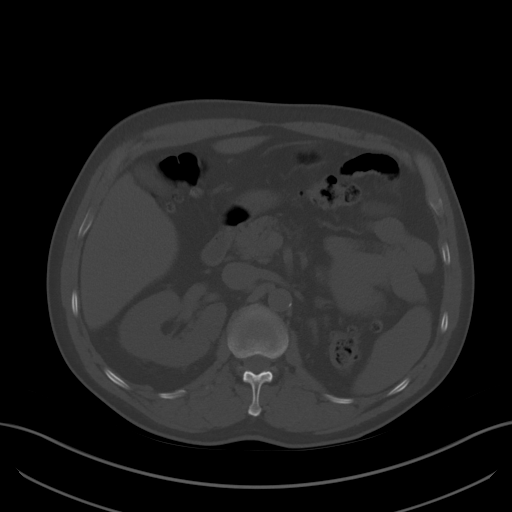
[im 73/94  soft-tissue]
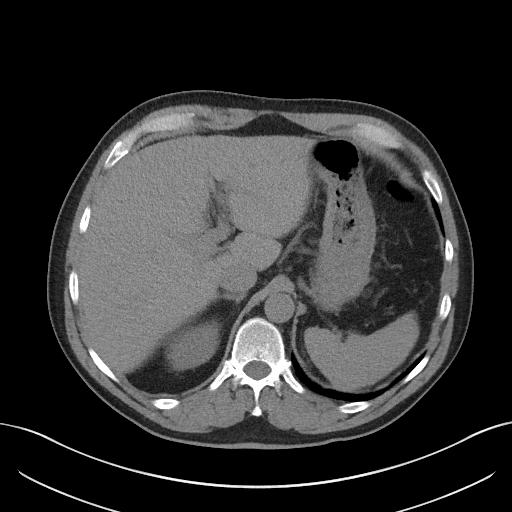
[im 81/94  soft-tissue]
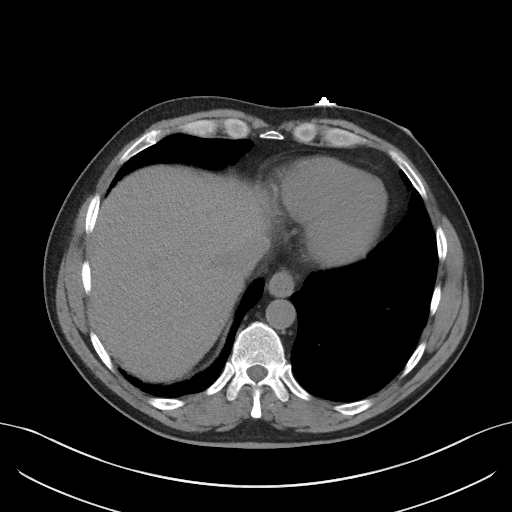
[im 89/94  soft-tissue]
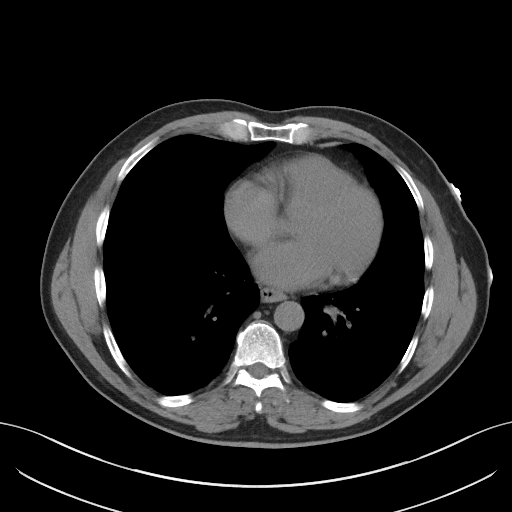

[Series 5: coronal · coronal · 0.76mm/px · 3 of 138 slices shown]
[im 46/138  soft-tissue]
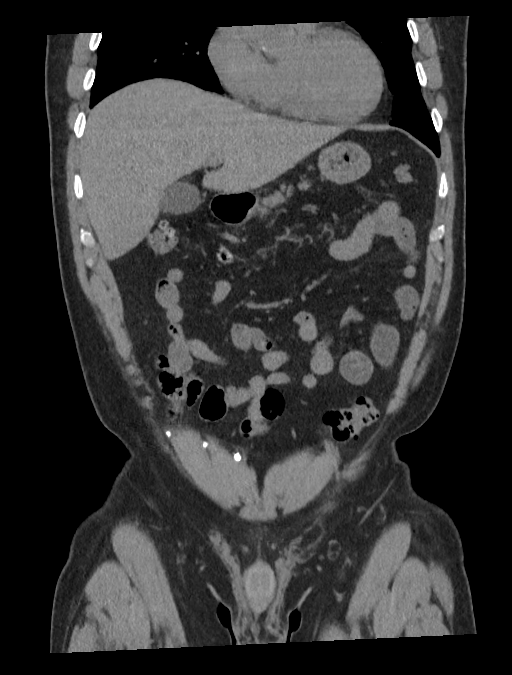
[im 61/138  soft-tissue]
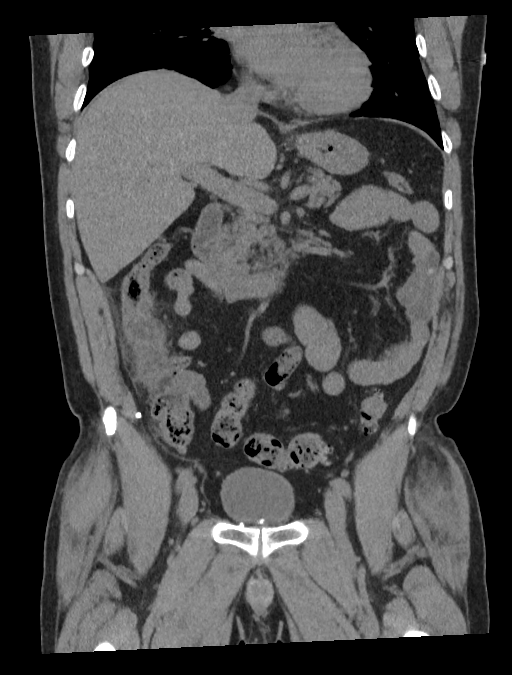
[im 77/138  soft-tissue]
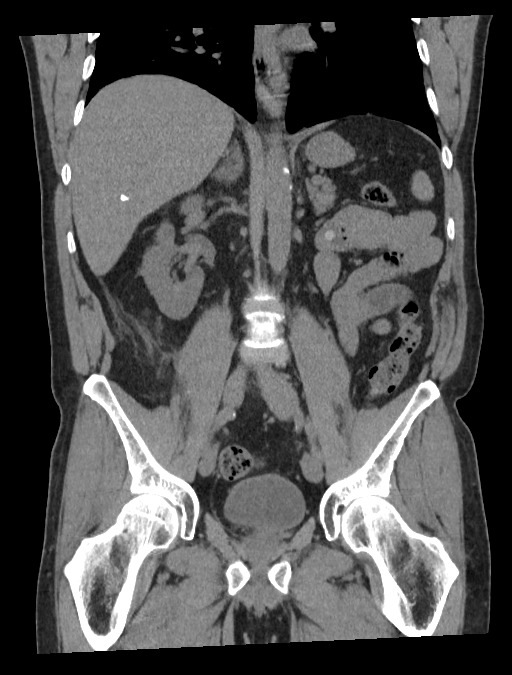

[15 of 46 positions shown; findings below may reference images not displayed]

FINDINGS: Lower chest: Lung bases demonstrate no acute consolidation or
effusion. Tiny 3 mm left upper lobe subpleural nodule, series 4
image number 2. Normal heart size.

Hepatobiliary: Calcifications in the right hepatic lobe which may be
secondary to prior granulomatous disease. No calcified gallstone or
biliary dilatation

Pancreas: Unremarkable. No pancreatic ductal dilatation or
surrounding inflammatory changes.

Spleen: Normal in size without focal abnormality.

Adrenals/Urinary Tract: Adrenal glands are normal. Right kidney
shows no hydronephrosis. Negative for right ureteral stone. Left
kidney is absent. The bladder is normal

Stomach/Bowel: Stomach nonenlarged. No dilated small bowel. Negative
appendix. Diffuse diverticular disease of the colon. Focal wall
thickening involving the ascending colon with moderate surrounding
inflammatory change.

Vascular/Lymphatic: Mild aortic atherosclerosis without aneurysm. No
significant adenopathy

Reproductive: Prostate is unremarkable.

Other: Negative for free air or free fluid. Evidence of prior right
lower quadrant hernia repair. Small fat containing left inguinal
hernia.

Musculoskeletal: Degenerative changes of the spine. No acute or
suspicious osseous abnormality.
IMPRESSION: 1. Negative for right hydronephrosis or ureteral stone.
2. Focal wall thickening with moderate inflammatory changes and
small amount of fluid at the ascending colon. Diverticular disease
is present and findings are suspected to represent acute right-sided
diverticulitis. No perforation or abscess. Appendix is normal.
3. The left kidney is either surgically or congenitally absent.
4. 3 mm left upper lobe pulmonary nodule. No follow-up needed if
patient is low-risk. Non-contrast chest CT can be considered in 12
months if patient is high-risk. This recommendation follows the
consensus statement: Guidelines for Management of Incidental
Pulmonary Nodules Detected on CT Images: From the [HOSPITAL]

## 2021-09-08 ENCOUNTER — Ambulatory Visit: Payer: Self-pay | Admitting: Internal Medicine

## 2021-09-29 ENCOUNTER — Encounter: Payer: Self-pay | Admitting: Internal Medicine

## 2021-09-29 ENCOUNTER — Ambulatory Visit (INDEPENDENT_AMBULATORY_CARE_PROVIDER_SITE_OTHER): Payer: 59 | Admitting: Internal Medicine

## 2021-09-29 ENCOUNTER — Other Ambulatory Visit: Payer: Self-pay

## 2021-09-29 VITALS — BP 132/68 | HR 84 | Ht 67.0 in | Wt 168.8 lb

## 2021-09-29 DIAGNOSIS — R911 Solitary pulmonary nodule: Secondary | ICD-10-CM | POA: Insufficient documentation

## 2021-09-29 DIAGNOSIS — Z8719 Personal history of other diseases of the digestive system: Secondary | ICD-10-CM

## 2021-09-29 DIAGNOSIS — N400 Enlarged prostate without lower urinary tract symptoms: Secondary | ICD-10-CM

## 2021-09-29 DIAGNOSIS — K580 Irritable bowel syndrome with diarrhea: Secondary | ICD-10-CM | POA: Diagnosis not present

## 2021-09-29 NOTE — Assessment & Plan Note (Signed)
Colonoscopy was unremarkable 2 years ago except for sigmoid adenoma ?

## 2021-09-29 NOTE — Assessment & Plan Note (Signed)
We will repeat chest CT ?

## 2021-09-29 NOTE — Assessment & Plan Note (Signed)
Take MiraLAX 1 scoop a day ?

## 2021-09-29 NOTE — Progress Notes (Signed)
? ?Established Patient Office Visit ? ?Subjective:  ?Patient ID: Eric Austin, male    DOB: 02/19/1961  Age: 61 y.o. MRN: [id number] ? ?CC:  ?Chief Complaint  ?Patient presents with  ? Abdominal Pain  ?  Patient has been having abdominal pain on and off for past couple of months. Patient states that pain last for about 2 days when it comes. Patient also complains of some constipation. Patient describes pain as sharp and fast.   ? ? ?Abdominal Pain ?Pertinent negatives include no hematuria.  ? ?Eric Austin presents for abdominal pin  and constipation, pulmonary nodule ? ?History reviewed. No pertinent past medical history. ? ?Past Surgical History:  ?Procedure Laterality Date  ? COLONOSCOPY WITH PROPOFOL N/A 07/25/2020  ? Procedure: COLONOSCOPY WITH PROPOFOL;  Surgeon: Jonathon Bellows, MD;  Location[facility] ENDOSCOPY;  Service: Gastroenterology;  Laterality: N/A;  ? HERNIA REPAIR    ? kidney removed    ? ? ?History reviewed. No pertinent family history. ? ?Social History  ? ?Socioeconomic History  ? Marital status: Married  ?  Spouse name: Not on file  ? Number of children: Not on file  ? Years of education: Not on file  ? Highest education level: Not on file  ?Occupational History  ? Not on file  ?Tobacco Use  ? Smoking status: Never  ? Smokeless tobacco: Never  ?Vaping Use  ? Vaping Use: Never used  ?Substance and Sexual Activity  ? Alcohol use: Yes  ?  Comment: 11 drinks per week   ? Drug use: Never  ? Sexual activity: Not on file  ?Other Topics Concern  ? Not on file  ?Social History Narrative  ? Not on file  ? ?Social Determinants of Health  ? ?Financial Resource Strain: Not on file  ?Food Insecurity: Not on file  ?Transportation Needs: Not on file  ?Physical Activity: Not on file  ?Stress: Not on file  ?Social Connections: Not on file  ?Intimate Partner Violence: Not on file  ? ? ?No current outpatient medications on file.  ? ?No Known Allergies ? ?ROS ?Review of Systems  ?Constitutional: Negative.   ?HENT: Negative.     ?Eyes: Negative.  Negative for pain and redness.  ?Respiratory: Negative.  Negative for choking and chest tightness.   ?Cardiovascular: Negative.  Negative for chest pain and leg swelling.  ?Gastrointestinal:  Positive for abdominal pain.  ?Endocrine: Negative.   ?Genitourinary: Negative.  Negative for hematuria and urgency.  ?Musculoskeletal: Negative.   ?Skin: Negative.   ?Allergic/Immunologic: Negative.   ?Neurological: Negative.  Negative for speech difficulty.  ?Hematological: Negative.   ?Psychiatric/Behavioral: Negative.  Negative for confusion.   ?All other systems reviewed and are negative. ? ?  ?Objective:  ?  ?Physical Exam ?Vitals reviewed.  ?Constitutional:   ?   Appearance: Normal appearance.  ?HENT:  ?   Mouth/Throat:  ?   Mouth: Mucous membranes are moist.  ?Eyes:  ?   Pupils: Pupils are equal, round, and reactive to light.  ?Neck:  ?   Vascular: No carotid bruit.  ?Cardiovascular:  ?   Rate and Rhythm: Normal rate and regular rhythm.  ?   Pulses: Normal pulses.  ?   Heart sounds: Normal heart sounds.  ?Pulmonary:  ?   Effort: Pulmonary effort is normal.  ?   Breath sounds: Normal breath sounds.  ?Abdominal:  ?   General: Bowel sounds are normal.  ?   Palpations: Abdomen is soft. There is no hepatomegaly, splenomegaly or  mass.  ?   Tenderness: There is no abdominal tenderness. There is no guarding or rebound. Negative signs include Murphy's sign and McBurney's sign.  ?   Hernia: No hernia is present.  ?Musculoskeletal:  ?   Cervical back: Neck supple.  ?   Right lower leg: No edema.  ?   Left lower leg: No edema.  ?Skin: ?   Findings: No rash.  ?Neurological:  ?   Mental Status: He is alert and oriented to person, place, and time.  ?   Motor: No weakness.  ?Psychiatric:     ?   Mood and Affect: Mood normal.     ?   Behavior: Behavior normal.  ? ? ?BP 132/68   Pulse 84   Ht '5\' 7"'$  (1.702 m)   Wt 168 lb 12.8 oz (76.6 kg)   BMI 26.44 kg/m?  ?Wt Readings from Last 3 Encounters:  ?09/29/21 168 lb  12.8 oz (76.6 kg)  ?07/25/20 168 lb (76.2 kg)  ?06/11/20 168 lb (76.2 kg)  ? ? ? ?Health Maintenance Due  ?Topic Date Due  ? COVID-19 Vaccine (1) Never done  ? HIV Screening  Never done  ? Hepatitis C Screening  Never done  ? TETANUS/TDAP  Never done  ? Zoster Vaccines- Shingrix (1 of 2) Never done  ? INFLUENZA VACCINE  Never done  ? ? ?There are no preventive care reminders to display for this patient. ? ?Lab Results  ?Component Value Date  ? TSH 2.45 05/14/2020  ? ?Lab Results  ?Component Value Date  ? WBC 4.0 05/14/2020  ? HGB 14.9 05/14/2020  ? HCT 42.5 05/14/2020  ? MCV 92.0 05/14/2020  ? PLT 218 05/14/2020  ? ?Lab Results  ?Component Value Date  ? NA 140 05/14/2020  ? K 3.8 05/14/2020  ? CO2 24 05/14/2020  ? GLUCOSE 98 05/14/2020  ? BUN 12 05/14/2020  ? CREATININE 1.28 05/14/2020  ? BILITOT 0.9 05/14/2020  ? ALKPHOS 65 08/30/2019  ? AST 32 05/14/2020  ? ALT 39 05/14/2020  ? PROT 6.9 05/14/2020  ? ALBUMIN 4.2 08/30/2019  ? CALCIUM 9.3 05/14/2020  ? ANIONGAP 11 08/30/2019  ? ?Lab Results  ?Component Value Date  ? CHOL 220 (H) 05/14/2020  ? ?Lab Results  ?Component Value Date  ? HDL 39 (L) 05/14/2020  ? ?Lab Results  ?Component Value Date  ? [place] 150 (H) 05/14/2020  ? ?Lab Results  ?Component Value Date  ? TRIG 172 (H) 05/14/2020  ? ?Lab Results  ?Component Value Date  ? CHOLHDL 5.6 (H) 05/14/2020  ? ?No results found for: HGBA1C ? ?  ?Assessment & Plan:  ? ?Problem List Items Addressed This Visit   ? ?  ? Digestive  ? Irritable bowel syndrome with diarrhea - Primary  ?  Take MiraLAX 1 scoop a day ?  ?  ?  ? Genitourinary  ? BPH without obstruction/lower urinary tract symptoms  ?  Stable ?  ?  ?  ? Other  ? History of diverticulitis of colon  ?  Colonoscopy was unremarkable 2 years ago except for sigmoid adenoma ?  ?  ? Pulmonary nodule  ?  We will repeat chest CT ?  ?  ? ? ?No orders of the defined types were placed in this encounter. ? ? ?Follow-up: No follow-ups on file.  ? ? ?Cletis Athens, MD ?

## 2021-09-29 NOTE — Assessment & Plan Note (Signed)
Stable

## 2021-09-30 NOTE — Addendum Note (Signed)
Addended by: Alois Cliche on: 09/30/2021 09:04 AM ? ? Modules accepted: Orders ? ?

## 2021-10-01 ENCOUNTER — Ambulatory Visit (INDEPENDENT_AMBULATORY_CARE_PROVIDER_SITE_OTHER): Payer: 59 | Admitting: *Deleted

## 2021-10-01 DIAGNOSIS — Z0000 Encounter for general adult medical examination without abnormal findings: Secondary | ICD-10-CM

## 2021-10-01 DIAGNOSIS — N400 Enlarged prostate without lower urinary tract symptoms: Secondary | ICD-10-CM

## 2021-10-02 LAB — COMPLETE METABOLIC PANEL WITH GFR
AG Ratio: 1.4 (calc) (ref 1.0–2.5)
ALT: 22 U/L (ref 9–46)
AST: 24 U/L (ref 10–35)
Albumin: 4.1 g/dL (ref 3.6–5.1)
Alkaline phosphatase (APISO): 70 U/L (ref 35–144)
BUN: 18 mg/dL (ref 7–25)
CO2: 23 mmol/L (ref 20–32)
Calcium: 9.3 mg/dL (ref 8.6–10.3)
Chloride: 106 mmol/L (ref 98–110)
Creat: 1.12 mg/dL (ref 0.70–1.35)
Globulin: 2.9 g/dL (calc) (ref 1.9–3.7)
Glucose, Bld: 99 mg/dL (ref 65–99)
Potassium: 4.7 mmol/L (ref 3.5–5.3)
Sodium: 140 mmol/L (ref 135–146)
Total Bilirubin: 0.6 mg/dL (ref 0.2–1.2)
Total Protein: 7 g/dL (ref 6.1–8.1)
eGFR: 75 mL/min/{1.73_m2} (ref >=60)

## 2021-10-02 LAB — CBC WITH DIFFERENTIAL/PLATELET
Absolute Monocytes: 670 cells/uL (ref 200–950)
Basophils Absolute: 68 cells/uL (ref 0–200)
Basophils Relative: 1.1 %
Eosinophils Absolute: 118 cells/uL (ref 15–500)
Eosinophils Relative: 1.9 %
HCT: 45.1 % (ref 38.5–50.0)
Hemoglobin: 15.3 g/dL (ref 13.2–17.1)
Lymphs Abs: 2201 cells/uL (ref 850–3900)
MCH: 31 pg (ref 27.0–33.0)
MCHC: 33.9 g/dL (ref 32.0–36.0)
MCV: 91.3 fL (ref 80.0–100.0)
MPV: 10.8 fL (ref 7.5–12.5)
Monocytes Relative: 10.8 %
Neutro Abs: 3143 cells/uL (ref 1500–7800)
Neutrophils Relative %: 50.7 %
Platelets: 272 10*3/uL (ref 140–400)
RBC: 4.94 10*6/uL (ref 4.20–5.80)
RDW: 11.8 % (ref 11.0–15.0)
Total Lymphocyte: 35.5 %
WBC: 6.2 10*3/uL (ref 3.8–10.8)

## 2021-10-02 LAB — LIPID PANEL
Cholesterol: 228 mg/dL — ABNORMAL HIGH (ref <200)
HDL: 55 mg/dL (ref >=40)
LDL Cholesterol (Calc): 146 mg/dL (calc) — ABNORMAL HIGH
Non-HDL Cholesterol (Calc): 173 mg/dL (calc) — ABNORMAL HIGH (ref <130)
Total CHOL/HDL Ratio: 4.1 (calc) (ref <5.0)
Triglycerides: 142 mg/dL (ref <150)

## 2021-10-02 LAB — TSH: TSH: 2.34 mIU/L (ref 0.40–4.50)

## 2021-10-02 LAB — PSA: PSA: 1.36 ng/mL (ref <=4.00)

## 2021-11-05 ENCOUNTER — Ambulatory Visit
Admission: RE | Admit: 2021-11-05 | Discharge: 2021-11-05 | Disposition: A | Discharge status: Home / Self Care | Payer: 59 | Source: Ambulatory Visit | Attending: Internal Medicine | Admitting: Internal Medicine

## 2021-11-05 DIAGNOSIS — R911 Solitary pulmonary nodule: Secondary | ICD-10-CM | POA: Diagnosis not present

## 2021-11-05 LAB — POCT I-STAT CREATININE: Creatinine, Ser: 1.2 mg/dL (ref 0.61–1.24)

## 2021-11-05 MED ORDER — IOHEXOL 300 MG/ML  SOLN
75.0000 mL | Freq: Once | INTRAMUSCULAR | Status: AC | PRN
Start: 2021-11-05 — End: 2021-11-05
  Administered 2021-11-05: 75 mL via INTRAVENOUS

## 2021-11-25 ENCOUNTER — Ambulatory Visit (INDEPENDENT_AMBULATORY_CARE_PROVIDER_SITE_OTHER): Payer: 59 | Admitting: Internal Medicine

## 2021-11-25 ENCOUNTER — Encounter: Payer: Self-pay | Admitting: Internal Medicine

## 2021-11-25 VITALS — BP 150/99 | HR 95 | Ht 67.0 in | Wt 168.3 lb

## 2021-11-25 DIAGNOSIS — I10 Essential (primary) hypertension: Secondary | ICD-10-CM | POA: Diagnosis not present

## 2021-11-25 DIAGNOSIS — M79671 Pain in right foot: Secondary | ICD-10-CM | POA: Insufficient documentation

## 2021-11-25 DIAGNOSIS — R911 Solitary pulmonary nodule: Secondary | ICD-10-CM

## 2021-11-25 DIAGNOSIS — K580 Irritable bowel syndrome with diarrhea: Secondary | ICD-10-CM

## 2021-11-25 DIAGNOSIS — N400 Enlarged prostate without lower urinary tract symptoms: Secondary | ICD-10-CM

## 2021-11-25 DIAGNOSIS — Z8719 Personal history of other diseases of the digestive system: Secondary | ICD-10-CM

## 2021-11-25 MED ORDER — LOSARTAN POTASSIUM-HCTZ 50-12.5 MG PO TABS: 1.0000 | ORAL_TABLET | Freq: Every day | ORAL | 2 refills | Status: DC

## 2021-11-25 NOTE — Assessment & Plan Note (Signed)
Refer to podiatrist 

## 2021-11-25 NOTE — Assessment & Plan Note (Signed)
Under control 

## 2021-11-25 NOTE — Assessment & Plan Note (Signed)
Stable

## 2021-11-25 NOTE — Assessment & Plan Note (Signed)
Stable at the present time. 

## 2021-11-25 NOTE — Assessment & Plan Note (Signed)

## 2021-11-25 NOTE — Progress Notes (Signed)
? ?Established Patient Office Visit ? ?Subjective:  ?Patient ID: Eric Austin, male    DOB: 12-Mar-1961  Age: 61 y.o. MRN: [id number] ? ?CC:  ?Chief Complaint  ?Patient presents with  ? ct scan results  ? ? ?HPI ? ?Eric Austin presents for elevated blood pressure and right foot pain, he does not smoke or drink. ?Suggest follow-up CT scan in a year.  No ?History of diabetes or asthma.  He has a 4 mm nodule on the left side of the chest he has a CT scan done and there is no change in the nodule.  I advised him to do another CT scan in a year. ? ?History reviewed. No pertinent past medical history. ? ?Past Surgical History:  ?Procedure Laterality Date  ? COLONOSCOPY WITH PROPOFOL N/A 07/25/2020  ? Procedure: COLONOSCOPY WITH PROPOFOL;  Surgeon: Jonathon Bellows, MD;  Location: [facility] ENDOSCOPY;  Service: Gastroenterology;  Laterality: N/A;  ? HERNIA REPAIR    ? kidney removed    ? ? ?History reviewed. No pertinent family history. ? ?Social History  ? ?Socioeconomic History  ? Marital status: Married  ?  Spouse name: Not on file  ? Number of children: Not on file  ? Years of education: Not on file  ? Highest education level: Not on file  ?Occupational History  ? Not on file  ?Tobacco Use  ? Smoking status: Never  ? Smokeless tobacco: Never  ?Vaping Use  ? Vaping Use: Never used  ?Substance and Sexual Activity  ? Alcohol use: Yes  ?  Comment: 11 drinks per week   ? Drug use: Never  ? Sexual activity: Not on file  ?Other Topics Concern  ? Not on file  ?Social History Narrative  ? Not on file  ? ?Social Determinants of Health  ? ?Financial Resource Strain: Not on file  ?Food Insecurity: Not on file  ?Transportation Needs: Not on file  ?Physical Activity: Not on file  ?Stress: Not on file  ?Social Connections: Not on file  ?Intimate Partner Violence: Not on file  ? ? ? ?Current Outpatient Medications:  ?  losartan-hydrochlorothiazide (HYZAAR) 50-12.5 MG tablet, Take 1 tablet by mouth daily., Disp: 90 tablet, Rfl: 2  ? ?No Known  Allergies ? ?ROS ?Review of Systems  ?Constitutional: Negative.   ?HENT: Negative.    ?Eyes: Negative.   ?Respiratory: Negative.    ?Cardiovascular: Negative.   ?Gastrointestinal: Negative.   ?Endocrine: Negative.   ?Genitourinary: Negative.   ?Musculoskeletal: Negative.   ?Skin: Negative.   ?Allergic/Immunologic: Negative.   ?Neurological: Negative.   ?Hematological: Negative.   ?Psychiatric/Behavioral: Negative.    ?All other systems reviewed and are negative. ? ?  ?Objective:  ?  ?Physical Exam ?Vitals reviewed.  ?Constitutional:   ?   Appearance: Normal appearance.  ?HENT:  ?   Mouth/Throat:  ?   Mouth: Mucous membranes are moist.  ?Eyes:  ?   Pupils: Pupils are equal, round, and reactive to light.  ?Neck:  ?   Vascular: No carotid bruit.  ?Cardiovascular:  ?   Rate and Rhythm: Normal rate and regular rhythm.  ?   Pulses: Normal pulses.  ?   Heart sounds: Normal heart sounds.  ?Pulmonary:  ?   Effort: Pulmonary effort is normal.  ?   Breath sounds: Normal breath sounds.  ?Abdominal:  ?   General: Bowel sounds are normal.  ?   Palpations: Abdomen is soft. There is no hepatomegaly, splenomegaly or mass.  ?  Tenderness: There is no abdominal tenderness.  ?   Hernia: No hernia is present.  ?Musculoskeletal:  ?   Cervical back: Neck supple.  ?   Right lower leg: No edema.  ?   Left lower leg: No edema.  ?Skin: ?   Findings: No rash.  ?Neurological:  ?   Mental Status: He is alert and oriented to person, place, and time.  ?   Motor: No weakness.  ?Psychiatric:     ?   Mood and Affect: Mood normal.     ?   Behavior: Behavior normal.  ? ? ?BP (!) 150/99   Pulse 95   Ht _0  (1.702 m)   Wt 168 lb 4.8 oz (76.3 kg)   BMI 26.36 kg/m?  ?Wt Readings from Last 3 Encounters:  ?11/25/21 168 lb 4.8 oz (76.3 kg)  ?09/29/21 168 lb 12.8 oz (76.6 kg)  ?07/25/20 168 lb (76.2 kg)  ? ? ? ?Health Maintenance Due  ?Topic Date Due  ? COVID-19 Vaccine (1) Never done  ? HIV Screening  Never done  ? Hepatitis C Screening  Never done  ?  TETANUS/TDAP  Never done  ? Zoster Vaccines- Shingrix (1 of 2) Never done  ? ? ?There are no preventive care reminders to display for this patient. ? ?Lab Results  ?Component Value Date  ? TSH 2.34 10/01/2021  ? ?Lab Results  ?Component Value Date  ? WBC 6.2 10/01/2021  ? HGB 15.3 10/01/2021  ? HCT 45.1 10/01/2021  ? MCV 91.3 10/01/2021  ? PLT 272 10/01/2021  ? ?Lab Results  ?Component Value Date  ? NA 140 10/01/2021  ? K 4.7 10/01/2021  ? CO2 23 10/01/2021  ? GLUCOSE 99 10/01/2021  ? BUN 18 10/01/2021  ? CREATININE 1.20 11/05/2021  ? BILITOT 0.6 10/01/2021  ? ALKPHOS 65 08/30/2019  ? AST 24 10/01/2021  ? ALT 22 10/01/2021  ? PROT 7.0 10/01/2021  ? ALBUMIN 4.2 08/30/2019  ? CALCIUM 9.3 10/01/2021  ? ANIONGAP 11 08/30/2019  ? EGFR 75 10/01/2021  ? ?Lab Results  ?Component Value Date  ? CHOL 228 (H) 10/01/2021  ? ?Lab Results  ?Component Value Date  ? HDL 55 10/01/2021  ? ?Lab Results  ?Component Value Date  ? LDLCALC 146 (H) 10/01/2021  ? ?Lab Results  ?Component Value Date  ? TRIG 142 10/01/2021  ? ?Lab Results  ?Component Value Date  ? CHOLHDL 4.1 10/01/2021  ? ?No results found for: HGBA1C ? ?  ?Assessment & Plan:  ? ?Problem List Items Addressed This Visit   ? ?  ? Cardiovascular and Mediastinum  ? Primary hypertension - Primary  ?   Patient denies any chest pain or shortness of breath there is no history of palpitation or paroxysmal nocturnal dyspnea ?  patient was advised to follow low-salt low-cholesterol diet ? ?  ideally I want to keep systolic blood pressure below 130 mmHg, patient was asked to check blood pressure one times a week and give me a report on that.  Patient will be follow-up in 3 months  or earlier as needed, patient will call me back for any change in the cardiovascular symptoms ?Patient was advised to buy a book from local bookstore concerning blood pressure and read several chapters  every day.  This will be supplemented by some of the material we will give him from the office.  Patient  should also utilize other resources like YouTube and Internet to learn more about the blood  pressure and the diet. ? ?  ?  ? Relevant Medications  ? losartan-hydrochlorothiazide (HYZAAR) 50-12.5 MG tablet  ?  ? Respiratory  ? Pulmonary nodule  ?  Patient was suggested to have a follow-up CT scan- The patient's GERD is stable on medication.  ?- Instructed the patient to avoid eating spicy and acidic foods, as well as foods high in fat. ?- Instructed the patient to avoid eating large meals or meals 2-3 hours prior to sleeping. ? ?  ?  ?  ? Digestive  ? Irritable bowel syndrome with diarrhea  ?  Under control ? ?  ?  ?  ? Genitourinary  ? BPH without obstruction/lower urinary tract symptoms  ?  Stable at the present time ? ?  ?  ?  ? Other  ? History of diverticulitis of colon  ?  Stable ? ?  ?  ? Right foot pain  ?  Refer to podiatrist ? ?  ?  ? ? ?Meds ordered this encounter  ?Medications  ? losartan-hydrochlorothiazide (HYZAAR) 50-12.5 MG tablet  ?  Sig: Take 1 tablet by mouth daily.  ?  Dispense:  90 tablet  ?  Refill:  2  ?DASH Diet: Care Instructions ?Your Care Instructions ? ?The DASH diet is an eating plan that can help lower your blood pressure. DASH stands for Dietary Approaches to Stop Hypertension. Hypertension is high blood pressure. ?The DASH diet focuses on eating foods that are high in calcium, potassium, and magnesium. These nutrients can lower blood pressure. The foods that are highest in these nutrients are fruits, vegetables, low-fat dairy products, nuts, seeds, and legumes. But taking calcium, potassium, and magnesium supplements instead of eating foods that are high in those nutrients does not have the same effect. The DASH diet also includes whole grains, fish, and poultry. ?The DASH diet is one of several lifestyle changes your doctor may recommend to lower your high blood pressure. Your doctor may also want you to decrease the amount of sodium in your diet. Lowering sodium while following the  DASH diet can lower blood pressure even further than just the DASH diet alone. ?Follow-up care is a key part of your treatment and safety. Be sure to make and go to all appointments, and call your doctor if you a

## 2021-11-25 NOTE — Assessment & Plan Note (Signed)
Patient was suggested to have a follow-up CT scan- The patient's GERD is stable on medication.  ?- Instructed the patient to avoid eating spicy and acidic foods, as well as foods high in fat. ?- Instructed the patient to avoid eating large meals or meals 2-3 hours prior to sleeping. ?

## 2021-11-26 NOTE — Addendum Note (Signed)
Addended by: Alois Cliche on: 11/26/2021 09:37 AM ? ? Modules accepted: Orders ? ?

## 2021-12-03 ENCOUNTER — Other Ambulatory Visit: Payer: Self-pay

## 2021-12-23 ENCOUNTER — Ambulatory Visit: Payer: 59 | Admitting: Podiatry

## 2021-12-23 ENCOUNTER — Ambulatory Visit (INDEPENDENT_AMBULATORY_CARE_PROVIDER_SITE_OTHER): Payer: 59

## 2021-12-23 DIAGNOSIS — M7751 Other enthesopathy of right foot: Secondary | ICD-10-CM

## 2021-12-23 NOTE — Progress Notes (Signed)
  Subjective:  Patient ID: Eric Austin, male    DOB: [birth year],  MRN: [id number]  No chief complaint on file.   61 y.o. male presents with the above complaint.  Patient presents with complaint of right first metatarsophalangeal joint pain.  Patient states painful to touch is progressive gotten worse.  He states that about a month ago was a lot more painful but now the pain has calmed down considerably.  He wanted get it eval make sure there is nothing going on.  He does not recall any trauma to the area.  He has not seen anyone else prior to seeing me.  He denies any other acute complaints.  He has been on for 3 months.  Pain scale 7 out of 10 however today is only 1 out of 10   Review of Systems: Negative except as noted in the HPI. Denies N/V/F/Ch.  No past medical history on file.  Current Outpatient Medications:    losartan-hydrochlorothiazide (HYZAAR) 50-12.5 MG tablet, Take 1 tablet by mouth daily., Disp: 90 tablet, Rfl: 2  Social History   Tobacco Use  Smoking Status Never  Smokeless Tobacco Never    No Known Allergies Objective:  There were no vitals filed for this visit. There is no height or weight on file to calculate BMI. Constitutional Well developed. Well nourished.  Vascular Dorsalis pedis pulses palpable bilaterally. Posterior tibial pulses palpable bilaterally. Capillary refill normal to all digits.  No cyanosis or clubbing noted. Pedal hair growth normal.  Neurologic Normal speech. Oriented to person, place, and time. Epicritic sensation to light touch grossly present bilaterally.  Dermatologic Nails well groomed and normal in appearance. No open wounds. No skin lesions.  Orthopedic: Pain at the end range of motion of the first metatarsophalangeal joint.  Hallux limitus noted.  No crepitus noted.  No deep intra-articular first MPJ pain noted.  No extensor health flexor tendinitis noted.  No pain at the sesamoidal complex   Radiographs: 3 views of  skeletally mature the right foot: Hallux limitus noted.  Squared metatarsal head noted.  No osteoarthritic changes noted. Assessment:   1. Capsulitis of metatarsophalangeal (MTP) joint of right foot    Plan:  Patient was evaluated and treated and all questions answered.  Right MTP capsulitis with underlying hallux limitus -All questions and concerns were discussed with the patient and various treatment options were discussed -I discussed shoe gear modification in extensive detail. -Clinically his pain has gone down considerably I will hold off on steroid injection I discussed with him if flares back up again to come back and see me and I can give him a steroid shot.  He states understanding.  No follow-ups on file.

## 2022-03-02 DIAGNOSIS — I10 Essential (primary) hypertension: Secondary | ICD-10-CM | POA: Diagnosis not present

## 2022-03-02 DIAGNOSIS — E669 Obesity, unspecified: Secondary | ICD-10-CM | POA: Diagnosis not present

## 2022-03-02 DIAGNOSIS — E1169 Type 2 diabetes mellitus with other specified complication: Secondary | ICD-10-CM | POA: Diagnosis not present

## 2022-03-02 DIAGNOSIS — E785 Hyperlipidemia, unspecified: Secondary | ICD-10-CM | POA: Diagnosis not present

## 2022-03-02 DIAGNOSIS — E0500 Thyrotoxicosis with diffuse goiter without thyrotoxic crisis or storm: Secondary | ICD-10-CM | POA: Diagnosis not present

## 2022-03-04 ENCOUNTER — Other Ambulatory Visit: Payer: Self-pay | Admitting: *Deleted

## 2022-03-04 DIAGNOSIS — I10 Essential (primary) hypertension: Secondary | ICD-10-CM

## 2022-03-04 MED ORDER — LOSARTAN POTASSIUM-HCTZ 50-12.5 MG PO TABS: 1.0000 | ORAL_TABLET | Freq: Every day | ORAL | 2 refills | Status: AC

## 2022-09-03 DIAGNOSIS — R29704 NIHSS score 4: Secondary | ICD-10-CM | POA: Diagnosis not present

## 2022-09-03 DIAGNOSIS — R29810 Facial weakness: Secondary | ICD-10-CM | POA: Diagnosis not present

## 2022-09-03 DIAGNOSIS — I639 Cerebral infarction, unspecified: Secondary | ICD-10-CM | POA: Diagnosis not present

## 2022-09-03 DIAGNOSIS — R29898 Other symptoms and signs involving the musculoskeletal system: Secondary | ICD-10-CM | POA: Diagnosis not present

## 2022-09-03 DIAGNOSIS — G8191 Hemiplegia, unspecified affecting right dominant side: Secondary | ICD-10-CM | POA: Diagnosis not present

## 2022-09-03 DIAGNOSIS — I10 Essential (primary) hypertension: Secondary | ICD-10-CM | POA: Diagnosis not present

## 2022-09-03 DIAGNOSIS — Z79899 Other long term (current) drug therapy: Secondary | ICD-10-CM | POA: Diagnosis not present

## 2022-09-03 DIAGNOSIS — I672 Cerebral atherosclerosis: Secondary | ICD-10-CM | POA: Diagnosis not present

## 2022-09-03 DIAGNOSIS — R471 Dysarthria and anarthria: Secondary | ICD-10-CM | POA: Diagnosis not present

## 2022-09-03 DIAGNOSIS — M6281 Muscle weakness (generalized): Secondary | ICD-10-CM | POA: Diagnosis not present

## 2022-09-03 DIAGNOSIS — R531 Weakness: Secondary | ICD-10-CM | POA: Diagnosis not present

## 2022-09-03 DIAGNOSIS — R4781 Slurred speech: Secondary | ICD-10-CM | POA: Diagnosis not present

## 2022-09-03 DIAGNOSIS — I517 Cardiomegaly: Secondary | ICD-10-CM | POA: Diagnosis not present

## 2022-09-03 DIAGNOSIS — E785 Hyperlipidemia, unspecified: Secondary | ICD-10-CM | POA: Diagnosis not present

## 2022-09-03 DIAGNOSIS — I6389 Other cerebral infarction: Secondary | ICD-10-CM | POA: Diagnosis not present

## 2022-09-03 DIAGNOSIS — G2581 Restless legs syndrome: Secondary | ICD-10-CM | POA: Diagnosis not present

## 2022-09-08 DIAGNOSIS — I639 Cerebral infarction, unspecified: Secondary | ICD-10-CM | POA: Diagnosis not present

## 2022-09-09 ENCOUNTER — Telehealth: Payer: Self-pay

## 2022-09-09 NOTE — Transitions of Care (Post Inpatient/ED Visit) (Signed)
   09/09/2022  Name: Eric Austin MRN: [id number] DOB: 02/10/1961  Today's TOC FU Call Status: Today's TOC FU Call Status:: Unsuccessul Call (1st Attempt) Unsuccessful Call (1st Attempt) Date: 09/09/22  Attempted to reach the patient regarding the most recent Inpatient/ED visit.  Follow Up Plan: Additional outreach attempts will be made to reach the patient to complete the Transitions of Care (Post Inpatient/ED visit) call.   [place] LPN [place] Advisor Direct Dial [phone]

## 2022-09-10 NOTE — Transitions of Care (Post Inpatient/ED Visit) (Signed)
   09/10/2022  Name: Eric Austin MRN: [id number] DOB: 1961-04-23  Today's TOC FU Call Status: Today's TOC FU Call Status:: Unsuccessful Call (2nd Attempt) Unsuccessful Call (1st Attempt) Date: 09/09/22 Unsuccessful Call (2nd Attempt) Date: 09/10/22  Attempted to reach the patient regarding the most recent Inpatient/ED visit.  Follow Up Plan: Additional outreach attempts will be made to reach the patient to complete the Transitions of Care (Post Inpatient/ED visit) call.   [place] LPN [place] Advisor Direct Dial [phone]

## 2022-09-14 DIAGNOSIS — I10 Essential (primary) hypertension: Secondary | ICD-10-CM | POA: Diagnosis not present

## 2022-09-14 DIAGNOSIS — Z0000 Encounter for general adult medical examination without abnormal findings: Secondary | ICD-10-CM | POA: Diagnosis not present

## 2022-09-14 DIAGNOSIS — G8191 Hemiplegia, unspecified affecting right dominant side: Secondary | ICD-10-CM | POA: Diagnosis not present

## 2022-09-14 DIAGNOSIS — G2581 Restless legs syndrome: Secondary | ICD-10-CM | POA: Diagnosis not present

## 2022-09-14 DIAGNOSIS — E7800 Pure hypercholesterolemia, unspecified: Secondary | ICD-10-CM | POA: Diagnosis not present

## 2022-09-14 DIAGNOSIS — Z8673 Personal history of transient ischemic attack (TIA), and cerebral infarction without residual deficits: Secondary | ICD-10-CM | POA: Diagnosis not present

## 2022-09-14 NOTE — Transitions of Care (Post Inpatient/ED Visit) (Signed)
   09/14/2022  Name: Eric Austin MRN: [id number] DOB: [birth year]  Today's TOC FU Call Status: Today's TOC FU Call Status:: Unsuccessful Call (3rd Attempt) Unsuccessful Call (1st Attempt) Date: 09/09/22 Unsuccessful Call (2nd Attempt) Date: 09/10/22 Unsuccessful Call (3rd Attempt) Date: 09/14/22  Attempted to reach the patient regarding the most recent Inpatient/ED visit.  Follow Up Plan: No further outreach attempts will be made at this time. We have been unable to contact the patient.  [place] LPN [place] Advisor Direct Dial [phone]

## 2022-09-28 ENCOUNTER — Ambulatory Visit: Payer: 59 | Attending: Internal Medicine

## 2022-09-28 DIAGNOSIS — M6281 Muscle weakness (generalized): Secondary | ICD-10-CM | POA: Insufficient documentation

## 2022-09-28 DIAGNOSIS — R278 Other lack of coordination: Secondary | ICD-10-CM | POA: Insufficient documentation

## 2022-09-30 ENCOUNTER — Encounter: Payer: Self-pay | Admitting: Speech Pathology

## 2022-09-30 ENCOUNTER — Ambulatory Visit: Payer: 59

## 2022-09-30 DIAGNOSIS — M6281 Muscle weakness (generalized): Secondary | ICD-10-CM | POA: Diagnosis not present

## 2022-09-30 DIAGNOSIS — R278 Other lack of coordination: Secondary | ICD-10-CM | POA: Diagnosis not present

## 2022-09-30 NOTE — Therapy (Signed)
OUTPATIENT OCCUPATIONAL THERAPY NEURO EVALUATION  Patient Name: Eric Austin MRN: [id number] DOB:[birth year], 62 y.o., male Today's Date: 10/04/2022  PCP: Dr. Frazier Richards REFERRING PROVIDER: Dr. Gasper Lloyd  END OF SESSION:  OT End of Session - 10/04/22 1905     Visit Number 1    Number of Visits 24    Date for OT Re-Evaluation 12/24/22    Progress Note Due on Visit 10    OT Start Time 1600    OT Stop Time 1645    OT Time Calculation (min) 45 min    Equipment Utilized During Treatment none    Activity Tolerance Patient tolerated treatment well    Behavior During Therapy [facility] for tasks assessed/performed             No past medical history on file. Past Surgical History:  Procedure Laterality Date   COLONOSCOPY WITH PROPOFOL N/A 07/25/2020   Procedure: COLONOSCOPY WITH PROPOFOL;  Surgeon: Jonathon Bellows, MD;  Location: [facility] ENDOSCOPY;  Service: Gastroenterology;  Laterality: N/A;   HERNIA REPAIR     kidney removed     Patient Active Problem List   Diagnosis Date Noted   Primary hypertension 11/25/2021   Right foot pain 11/25/2021   Pulmonary nodule 09/29/2021   History of diverticulitis of colon 06/11/2020   Annual physical exam 05/15/2020   BPH without obstruction/lower urinary tract symptoms 05/15/2020   Primary osteoarthritis of right knee 05/15/2020   Irritable bowel syndrome with diarrhea 05/15/2020    ONSET DATE: Feb. 2024  REFERRING DIAG: I63.9 (ICD-10-CM) - Cerebral infarction, unspecified   THERAPY DIAG:  Muscle weakness (generalized)  Other lack of coordination  Rationale for Evaluation and Treatment: Rehabilitation  SUBJECTIVE:  SUBJECTIVE STATEMENT: Pt reports he's getting some more strength back in his hand, but it's still weak. Pt accompanied by: significant other  PERTINENT HISTORY:  Per [facility] chart, Eric Austin is a 62 y.o. male with a past medical history of HTN who was transferred to [facility] on 09/03/2022 with acute stroke  affecting left pyramidal tract. Initially, PT/OT recommended discharge to [facility]. However, his condition improved and ultimately PT/OT/ST recommended outpatient treatment 3x weekly.   PRECAUTIONS: None  WEIGHT BEARING RESTRICTIONS: No  PAIN:  Are you having pain? no  FALLS: Has patient fallen in last 6 months? No  LIVING ENVIRONMENT: Lives with: lives with their spouse Lives in: 1 level home Stairs: Yes: External: 4 steps; on right going up Has following equipment at home:  was using a 4 pronged cane but no longer having to use it, built in shower seat   PLOF: Independent, full time landscaping  PATIENT GOALS: Get my right hand stronger and be able to write my name and eat with my right hand. OBJECTIVE:   HAND DOMINANCE: Right  ADLs: Overall ADLs: extra time; difficulty using the R hand Transfers/ambulation related to ADLs: indep with dressing/bathing Eating: just started eating with the R hand but with difficulty Grooming: pt tries a little with the R, but has to use L mostly UB Dressing: difficulty with clothing fasteners LB Dressing: difficulty with clothing fasteners Toileting: indep Bathing: indep Tub Shower transfers: indep Equipment:  see above  IADLs: Shopping: supv Light housekeeping: with effort Meal Prep: cooking on the [employer] mobility: ambulatory without AD Medication management: supv, occasional reminders Financial management: indep Handwriting: 100% legible and Increased time, pt reports almost to his baseline with handwriting.   MOBILITY STATUS:  ambulatory without AD  POSTURE COMMENTS:  No Significant postural  limitations Sitting balance: Moves/returns truncal midpoint >2 inches in all planes  ACTIVITY TOLERANCE: Activity tolerance: WFL for assessment activities  FUNCTIONAL OUTCOME MEASURES: FOTO: 65; predicted 73  UPPER EXTREMITY ROM:  BUEs WFL  UPPER EXTREMITY MMT:     MMT Right eval Left eval  Shoulder flexion 4- 5  Shoulder  abduction 4- 5  Shoulder adduction    Shoulder extension    Shoulder internal rotation    Shoulder external rotation    Middle trapezius    Lower trapezius    Elbow flexion 4+ 5  Elbow extension 4+ 5  Wrist flexion 4+ 5  Wrist extension 4+ 5  Wrist ulnar deviation    Wrist radial deviation    Wrist pronation    Wrist supination    (Blank rows = not tested)  HAND FUNCTION: Grip strength: Right: 37 lbs; Left: 64 lbs, Lateral pinch: Right: 11 lbs, Left: 18 lbs, and 3 point pinch: Right: 13 lbs, Left: 20 lbs  COORDINATION: Finger Nose Finger test: good, slightly slower than L 9 Hole Peg test: Right: 35 sec; Left: 25 sec  SENSATION: WFL Light touch: WFL  EDEMA: none  MUSCLE TONE: RUE: Within functional limits  COGNITION: Overall cognitive status: mood lability; pt intermittently tearful x2 during eval  VISION: Subjective report: Pt reports that he wears hard contacts and only put a contact in the L eye today d/t difficulty.  Pt reports denies any changes in vision since CVA.  VISION ASSESSMENT: Ocular ROM: WFL Tracking/Visual pursuits: Able to track stimulus in all quads without difficulty Saccades: WFL Visual Fields: no apparent deficits  PERCEPTION: WFL  PRAXIS: Impaired: Motor planning  OBSERVATIONS: Pt making good efforts to engage the R hand for all assessment activities.   TODAY'S TREATMENT:                                                                                                                              DATE: 10/01/22 Evaluation completed.  Goals established and poc reviewed.  Therapeutic Exercise: Issued green medium resistance theraputty and instructed pt in strengthening and coordination exercises for R hand, including gross grasping, lateral/2 point/3 point pinching, digit abd/add, and digging coins out of putty.  Able to return demo with intermittent vc for technique to improve quality of movement.  Encouraged completion 5-10 min, 1-3x per day.   Handout issued and reviewed with pt.  PATIENT EDUCATION: Education details: OT role, goals, poc, theraputty exercises Person educated: Patient Education method: Explanation and Verbal cues Education comprehension: verbalized understanding, returned demonstration, verbal cues required, and needs further education  HOME EXERCISE PROGRAM: Green medium resistance theraputty  GOALS: Goals reviewed with patient? Yes  SHORT TERM GOALS: Target date: 11/12/22 (6 weeks)  Pt will be indep to perform HEP for improving strength and coordination throughout the RUE. Baseline: Eval: initiate putty exercises at eval, further training needed Goal status: INITIAL  LONG TERM GOALS: Target date: 12/24/22 (12 weeks)  Pt will  increase FOTO score to 73 or better to indicate improvement in self perceived functional use of the R arm with daily tasks. Baseline: Eval: 65 Goal status: INITIAL  2.  Pt will increase R grip strength by 20 or more lbs in order to improve ability to hold and carry ADL supplies.   Baseline: Eval: R 37 (L 64) Goal status: INITIAL  3.  Pt will improve FMC/dexterity skills in R  hand to be able to pick up coins or pills from a table with greater ease.  Baseline: Eval: with difficulty/increased time (9 hole peg test R 35 sec, L 25 sec) Goal status: INITIAL  4.  Pt will use the R hand to eat with good accuracy and adapted utensils as needed. Baseline: Eval: pt just started eating with the R hand, but with effort and some mess Goal status: INITIAL  5.  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  Baseline: R shoulder 4-, elbow and wrist 4+ (See LTG #2 for R hand strength); LUE 5/5 throughout  Goal status: INITIAL  ASSESSMENT:  CLINICAL IMPRESSION: Patient is a 61 y.o. male who was seen today for occupational therapy evaluation for L CVA.  Pt presents with RUE weakness, mild weakness proximally and  moderate distally.  Pt reports he's been trying to use the R hand as much as possible, and states he's seen a lot of improvement recently.  Pt reports handwriting is nearly at baseline, but pt is still struggling to eat with his R hand, and his hand weakness makes it difficult to hold and carry ADL supplies in the R dominant hand.  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  Pt states that he can operate a riding mower but can not yet manage a weed eater and other lawn/power tools.  Pt will benefit from skilled OT to address RUE weakness and coordination deficits in order to maximize indep and use of the R dominant arm for daily tasks, improve function for return to work, and improve QOL.  PERFORMANCE DEFICITS: in functional skills including ADLs, IADLs, coordination, dexterity, strength, Fine motor control, Gross motor control, decreased knowledge of use of DME, and UE functional use, cognitive skills including emotional, and psychosocial skills including coping strategies and routines and behaviors.   IMPAIRMENTS: are limiting patient from ADLs, IADLs, work, and leisure.   CO-MORBIDITIES: has no other co-morbidities that affects occupational performance. Patient will benefit from skilled OT to address above impairments and improve overall function.  MODIFICATION OR ASSISTANCE TO COMPLETE EVALUATION: No modification of tasks or assist necessary to complete an evaluation.  OT OCCUPATIONAL PROFILE AND HISTORY: Problem focused assessment: Including review of records relating to presenting problem.  CLINICAL DECISION MAKING: Moderate - several treatment options, min-mod task modification necessary  REHAB POTENTIAL: Good  EVALUATION COMPLEXITY: Moderate    PLAN:  OT FREQUENCY: 2x/week  OT DURATION: 12 weeks  PLANNED INTERVENTIONS: self care/ADL training, therapeutic exercise, therapeutic activity, neuromuscular re-education, manual therapy, passive range of  motion, moist heat, cryotherapy, contrast bath, patient/family education, coping strategies training, and DME and/or AE instructions  RECOMMENDED OTHER SERVICES: None at this time  CONSULTED AND AGREED WITH PLAN OF CARE: Patient  PLAN FOR NEXT SESSION: HEP review, neuro re-ed and therapeutic exercises  Leta Speller, MS, OTR/L  Darleene Cleaver, OT 10/04/2022, 7:07 PM

## 2022-10-05 ENCOUNTER — Ambulatory Visit: Payer: 59

## 2022-10-05 DIAGNOSIS — M6281 Muscle weakness (generalized): Secondary | ICD-10-CM

## 2022-10-05 DIAGNOSIS — R278 Other lack of coordination: Secondary | ICD-10-CM

## 2022-10-05 IMAGING — CT CT CHEST W/ CM
2 of 4 series · 15 of 36 positions shown, 18 images · IV contrast (agent unspecified)
Comparison: Lung bases from abdominopelvic CT 08/30/2019

CLINICAL DATA: Abnormal xray - lung nodule, >= 1 cm

EXAM:
CT CHEST WITH CONTRAST
TECHNIQUE: Multidetector CT imaging of the chest was performed during
intravenous contrast administration.

[Series 2: axial st · axial · 0.67mm/px · z∈[-635,-369]mm · 12 of 159 slices shown, 15 images]
[im 13/159  mediastinal]
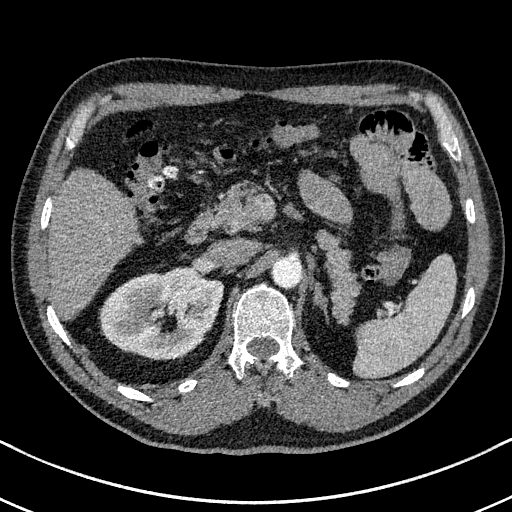
[im 13/159  lung]
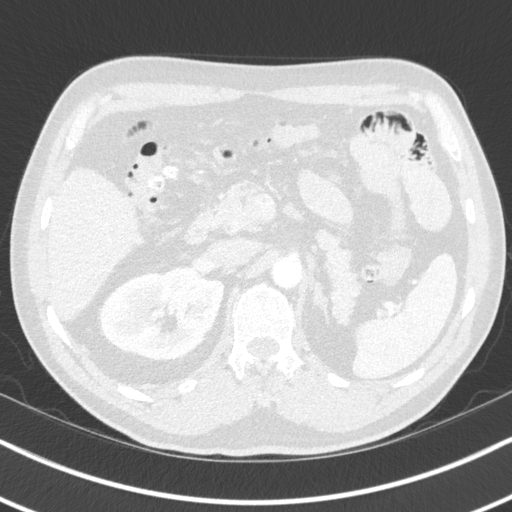
[im 25/159  lung]
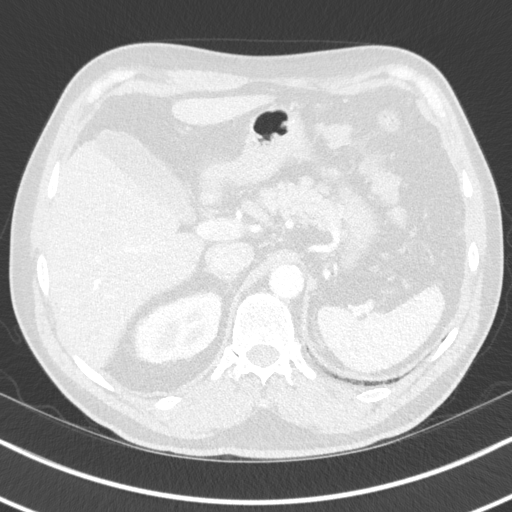
[im 37/159  lung]
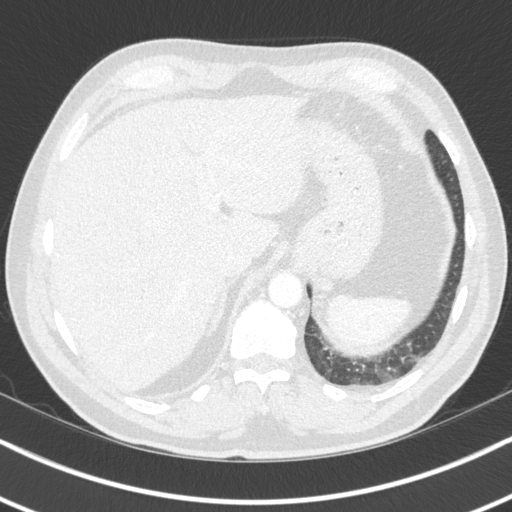
[im 49/159  lung]
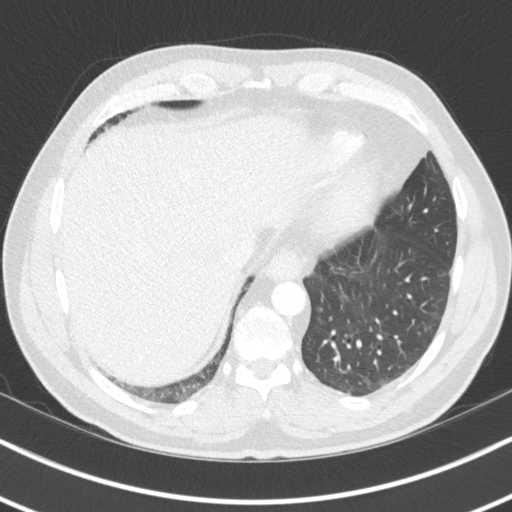
[im 61/159  mediastinal]
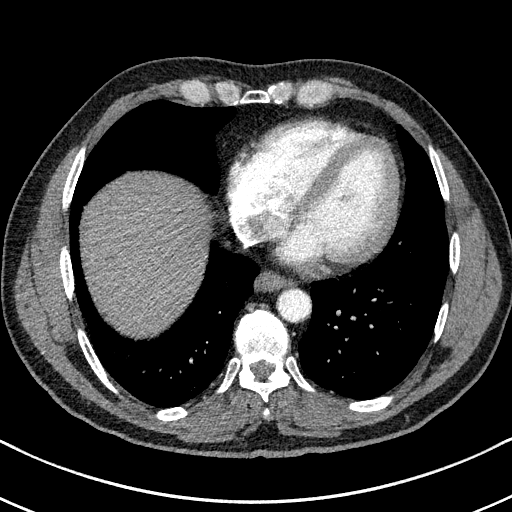
[im 61/159  lung]
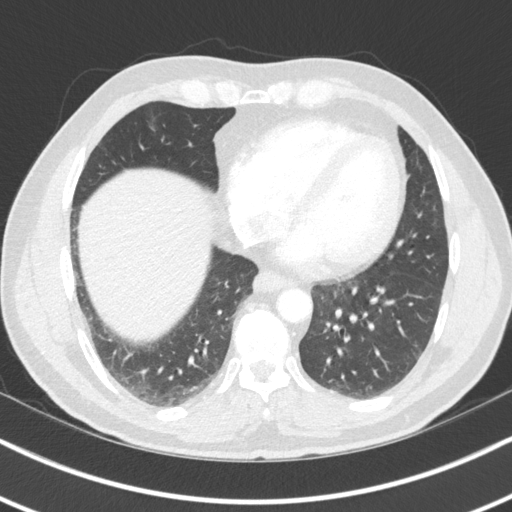
[im 73/159  lung]
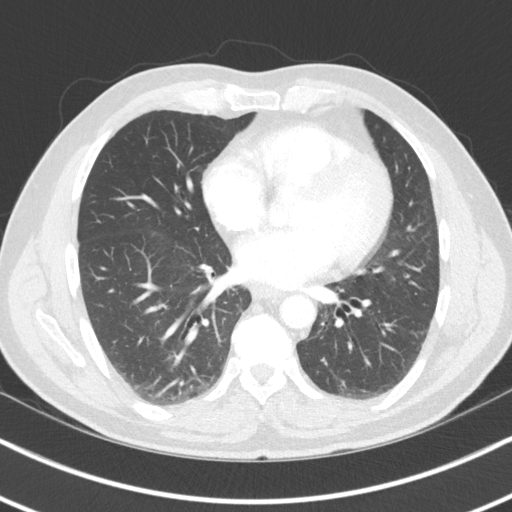
[im 86/159  lung]
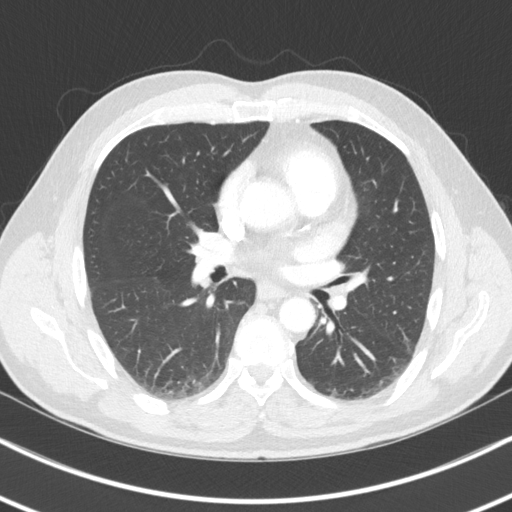
[im 98/159  lung]
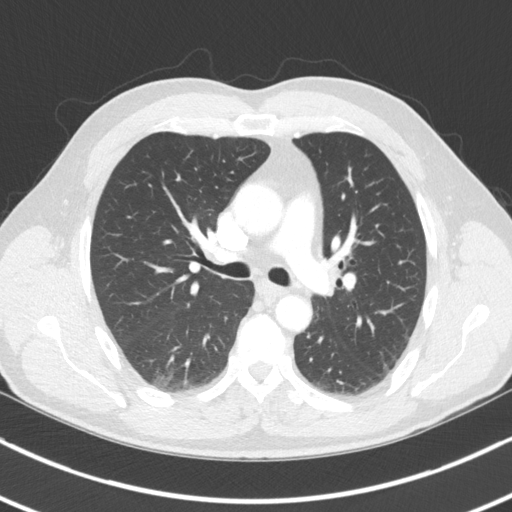
[im 110/159  mediastinal]
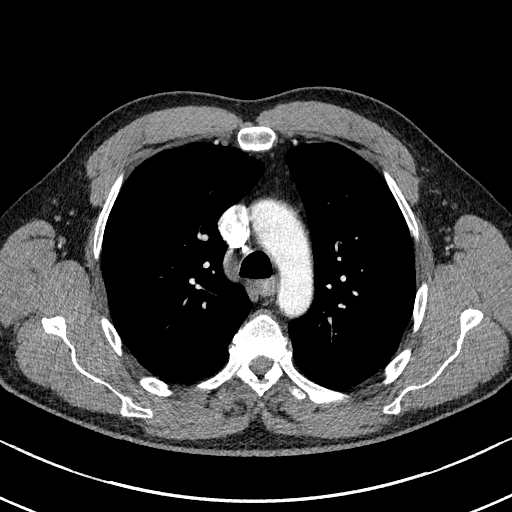
[im 110/159  lung]
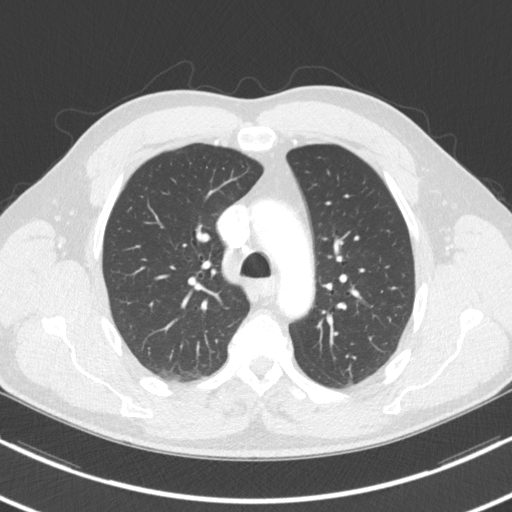
[im 122/159  lung]
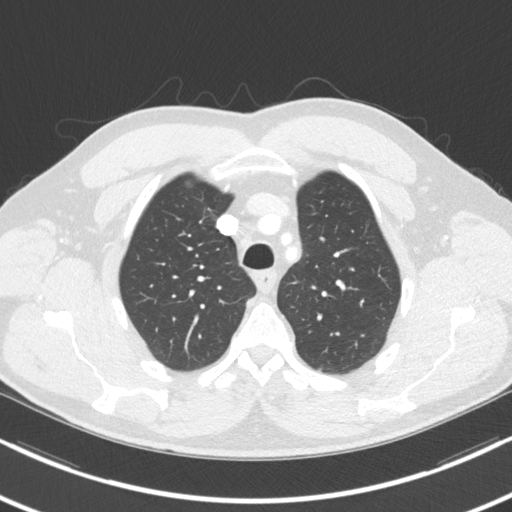
[im 134/159  lung]
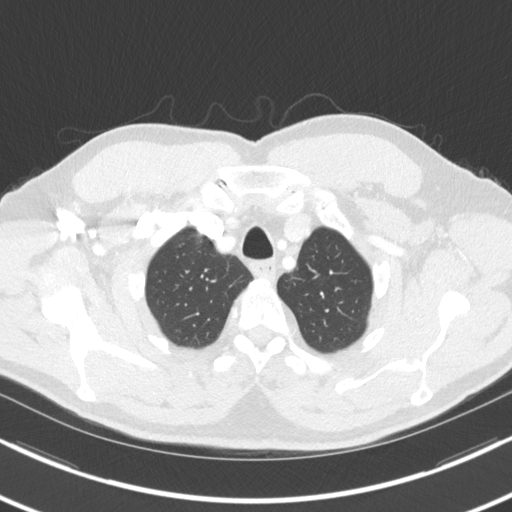
[im 146/159  lung]
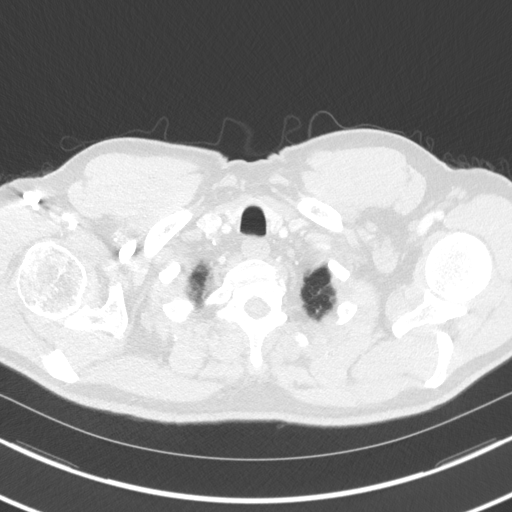

[Series 5: coronal · coronal · 0.64mm/px · 3 of 133 slices shown]
[im 27/133  lung]
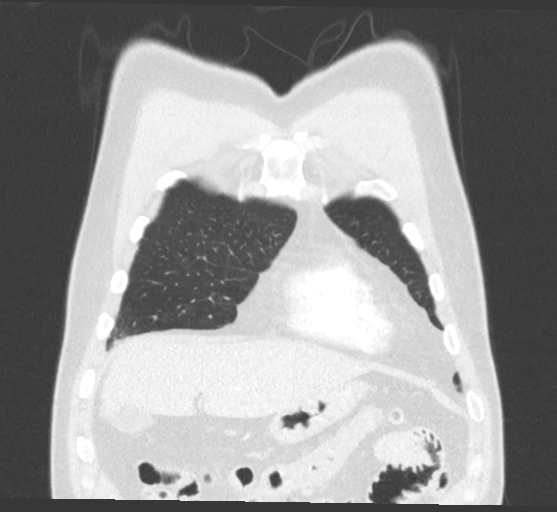
[im 53/133  lung]
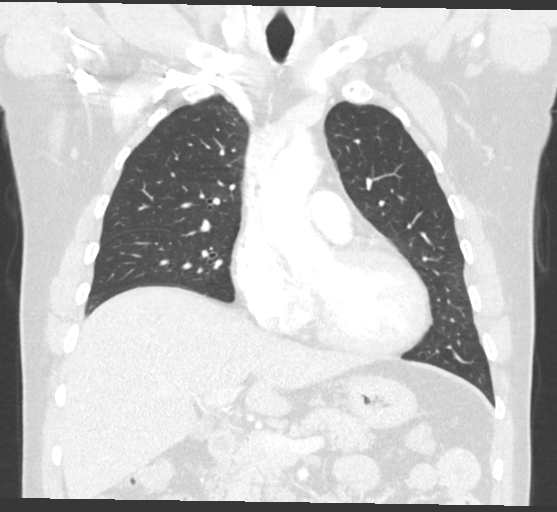
[im 80/133  lung]
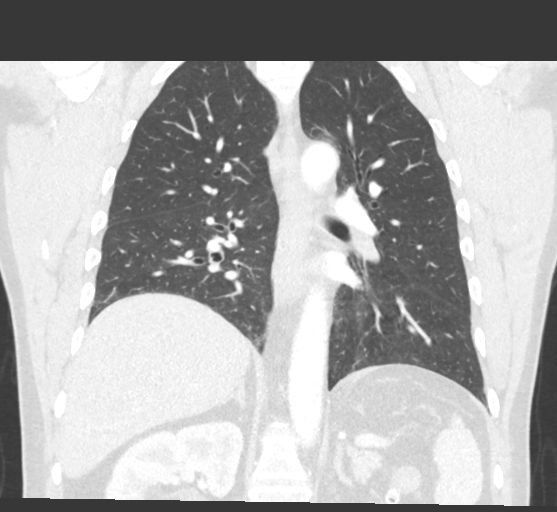

[15 of 36 positions shown; findings below may reference images not displayed]

RADIATION DOSE REDUCTION: This exam was performed according to the
departmental dose-optimization program which includes automated
exposure control, adjustment of the mA and/or kV according to
patient size and/or use of iterative reconstruction technique.

CONTRAST:  75mL OMNIPAQUE IOHEXOL 300 MG/ML  SOLN
FINDINGS: Cardiovascular: The thoracic aorta is normal in caliber. Minimal
aortic atherosclerosis. The heart is normal in size. There are
coronary artery calcifications. No pericardial effusion. Exam not
tailored for pulmonary artery evaluation, allowing for this, there
is no pulmonary embolus.

Mediastinum/Nodes: Scattered small mediastinal and hilar lymph nodes
are not enlarged by size criteria. Small hiatal hernia. Mild distal
esophageal wall thickening. No suspicious thyroid nodule.

Lungs/Pleura: The pleural based left upper lobe pulmonary nodule
measures 4 mm on the current exam, series 3, image 77. This
previously measured 3 mm, essentially unchanged allowing for
differences in slice selection. No additional pulmonary nodules. No
focal airspace disease. No pleural effusion. Trachea and central
bronchi are patent.

Upper Abdomen: No acute upper abdominal findings. Diverticulosis of
the included colon. Solitary right kidney.

Musculoskeletal: There are no acute or suspicious osseous
abnormalities. There are no chest wall soft tissue abnormalities.
IMPRESSION: 1. The pleural based left upper lobe pulmonary nodule is essentially
unchanged from prior exam at 4 mm. This exam constitutes greater
than 2 years of imaging stability and is consistent with a benign
process. No further nodule follow-up is needed
2. Small hiatal hernia. Mild distal esophageal wall thickening, can
be seen with reflux or esophagitis.
3. Coronary artery calcifications.

## 2022-10-05 NOTE — Therapy (Signed)
OUTPATIENT OCCUPATIONAL THERAPY NEURO TREATMENT NOTE  Patient Name: Eric Austin MRN: [id number] DOB:[birth year], 61 y.o., male Today's Date: 10/06/2022  PCP: Dr. Frazier Richards REFERRING PROVIDER: Dr. Gasper Lloyd  END OF SESSION:  OT End of Session - 10/06/22 0856     Visit Number 2    Number of Visits 24    Date for OT Re-Evaluation 12/24/22    Progress Note Due on Visit 10    OT Start Time 1645    OT Stop Time 1730    OT Time Calculation (min) 45 min    Equipment Utilized During Treatment none    Activity Tolerance Patient tolerated treatment well    Behavior During Therapy [facility] for tasks assessed/performed            No past medical history on file. Past Surgical History:  Procedure Laterality Date   COLONOSCOPY WITH PROPOFOL N/A 07/25/2020   Procedure: COLONOSCOPY WITH PROPOFOL;  Surgeon: Jonathon Bellows, MD;  Location: [facility] ENDOSCOPY;  Service: Gastroenterology;  Laterality: N/A;   HERNIA REPAIR     kidney removed     Patient Active Problem List   Diagnosis Date Noted   Primary hypertension 11/25/2021   Right foot pain 11/25/2021   Pulmonary nodule 09/29/2021   History of diverticulitis of colon 06/11/2020   Annual physical exam 05/15/2020   BPH without obstruction/lower urinary tract symptoms 05/15/2020   Primary osteoarthritis of right knee 05/15/2020   Irritable bowel syndrome with diarrhea 05/15/2020    ONSET DATE: Feb. 2024  REFERRING DIAG: I63.9 (ICD-10-CM) - Cerebral infarction, unspecified   THERAPY DIAG:  Muscle weakness (generalized)  Other lack of coordination  Rationale for Evaluation and Treatment: Rehabilitation  SUBJECTIVE:  SUBJECTIVE STATEMENT: Pt reports having a good weekend and consistently working on his exercises at home.   Pt accompanied by: significant other  PERTINENT HISTORY:  Per [facility] chart, Eric Austin is a 62 y.o. male with a past medical history of HTN who was transferred to [facility] on 09/[facility]/2022 with acute stroke  affecting left pyramidal tract. Initially, PT/OT recommended discharge to [facility]. However, his condition improved and ultimately PT/OT/ST recommended outpatient treatment 3x weekly.   PRECAUTIONS: None  WEIGHT BEARING RESTRICTIONS: No  PAIN:  Are you having pain? no  FALLS: Has patient fallen in last 6 months? No  LIVING ENVIRONMENT: Lives with: lives with their spouse Lives in: 1 level home Stairs: Yes: External: 4 steps; on right going up Has following equipment at home:  was using a 4 pronged cane but no longer having to use it, built in shower seat   PLOF: Independent, full time landscaping  PATIENT GOALS: Get my right hand stronger and be able to write my name and eat with my right hand. OBJECTIVE:   HAND DOMINANCE: Right  ADLs: Overall ADLs: extra time; difficulty using the R hand Transfers/ambulation related to ADLs: indep with dressing/bathing Eating: just started eating with the R hand but with difficulty Grooming: pt tries a little with the R, but has to use L mostly UB Dressing: difficulty with clothing fasteners LB Dressing: difficulty with clothing fasteners Toileting: indep Bathing: indep Tub Shower transfers: indep Equipment:  see above  IADLs: Shopping: supv Light housekeeping: with effort Meal Prep: cooking on the [employer] mobility: ambulatory without AD Medication management: supv, occasional reminders Financial management: indep Handwriting: 100% legible and Increased time, pt reports almost to his baseline with handwriting.   MOBILITY STATUS:  ambulatory without AD  POSTURE COMMENTS:  No Significant  postural limitations Sitting balance: Moves/returns truncal midpoint >2 inches in all planes  ACTIVITY TOLERANCE: Activity tolerance: WFL for assessment activities  FUNCTIONAL OUTCOME MEASURES: FOTO: 65; predicted 73  UPPER EXTREMITY ROM:  BUEs WFL  UPPER EXTREMITY MMT:     MMT Right eval Left eval  Shoulder flexion 4- 5  Shoulder  abduction 4- 5  Shoulder adduction    Shoulder extension    Shoulder internal rotation    Shoulder external rotation    Middle trapezius    Lower trapezius    Elbow flexion 4+ 5  Elbow extension 4+ 5  Wrist flexion 4+ 5  Wrist extension 4+ 5  Wrist ulnar deviation    Wrist radial deviation    Wrist pronation    Wrist supination    (Blank rows = not tested)  HAND FUNCTION: Grip strength: Right: 37 lbs; Left: 64 lbs, Lateral pinch: Right: 11 lbs, Left: 18 lbs, and 3 point pinch: Right: 13 lbs, Left: 20 lbs  COORDINATION: Finger Nose Finger test: good, slightly slower than L 9 Hole Peg test: Right: 35 sec; Left: 25 sec  SENSATION: WFL Light touch: WFL  EDEMA: none  MUSCLE TONE: RUE: Within functional limits  COGNITION: Overall cognitive status: mood lability; pt intermittently tearful x2 during eval  VISION: Subjective report: Pt reports that he wears hard contacts and only put a contact in the L eye today d/t difficulty.  Pt reports denies any changes in vision since CVA.  VISION ASSESSMENT: Ocular ROM: WFL Tracking/Visual pursuits: Able to track stimulus in all quads without difficulty Saccades: WFL Visual Fields: no apparent deficits  PERCEPTION: WFL  PRAXIS: Impaired: Motor planning  OBSERVATIONS: Pt making good efforts to engage the R hand for all assessment activities.   TODAY'S TREATMENT:                                                                                                                              DATE: 10/05/22 Therapeutic Exercise: Facilitated hand strengthening with use of hand gripper set at 11.2# to remove jumbo pegs from pegboard x2 trials using R hand. Pt required intermittent assist to reposition hand gripper in hand to ensure R fifth digit was engaged in each gripping rep.  Intermittent min vc to slow pace to improve timing of release to reduce dropped pegs.  Initiated red theraband strengthening for RUE.  Pt performed bilat shoulder  horiz abd, R shoulder elevation, R elbow flex, and R elbow extension.  Handout issued for completion at home.  Pt performed 2 sets 10 reps with intermittent min vc and tactile cues to optimize form and technique.  Facilitated R forearm, wrist, and hand strengthening with participation in [place] board tools.  Pt worked with long handled tool to facilitate R wrist flex/ext, small and large base key turn, and small and large dial turn, x3 reps for each tool (up/down board=1 rep).  Rest breaks between sets and min-mod vc for technique to maximize  ROM with each tool rotation against wide strip of velcro resistance, with intermittent min guard to maintain tool level on board.  PATIENT EDUCATION: Education details: red theraband exercises for RUE strengthening Person educated: Patient Education method: Explanation and Verbal cues, demo, handout Education comprehension: verbalized understanding, returned demonstration, verbal cues required, and needs further education  HOME EXERCISE PROGRAM: Green medium resistance theraputty, red theraband exercises   GOALS: Goals reviewed with patient? Yes  SHORT TERM GOALS: Target date: 11/12/22 (6 weeks)  Pt will be indep to perform HEP for improving strength and coordination throughout the RUE. Baseline: Eval: initiate putty exercises at eval, further training needed Goal status: INITIAL  LONG TERM GOALS: Target date: 12/24/22 (12 weeks)  Pt will increase FOTO score to 73 or better to indicate improvement in self perceived functional use of the R arm with daily tasks. Baseline: Eval: 65 Goal status: INITIAL  2.  Pt will increase R grip strength by 20 or more lbs in order to improve ability to hold and carry ADL supplies.   Baseline: Eval: R 37 (L 64) Goal status: INITIAL  3.  Pt will improve FMC/dexterity skills in R  hand to be able to pick up coins or pills from a table with greater ease.  Baseline: Eval: with difficulty/increased time (9 hole peg test R 35  sec, L 25 sec) Goal status: INITIAL  4.  Pt will use the R hand to eat with good accuracy and adapted utensils as needed. Baseline: Eval: pt just started eating with the R hand, but with effort and some mess Goal status: INITIAL  5.  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  Baseline: R shoulder 4-, elbow and wrist 4+ (See LTG #2 for R hand strength); LUE 5/5 throughout  Goal status: INITIAL  ASSESSMENT: CLINICAL IMPRESSION: Pt reports having a good weekend and consistently working on his exercises at home.  Pt tolerated all therapeutic exercises well this date.  Red theraband exercises initiated for RUE strengthening; handout issued to add to HEP.  Pt able to perform theraband exercises with intermittent min vc and tactile cues to optimize form and technique.  Encouraged completion of these exercises most days of the week to build strength in the RUE for operating landscaping equipment for work.  Pt continues to present with weakness throughout the RUE.  Pt will continue to benefit from skilled OT to address RUE weakness and coordination deficits in order to maximize indep and use of the R dominant arm for daily tasks, improve function for return to work, and improve QOL.  PERFORMANCE DEFICITS: in functional skills including ADLs, IADLs, coordination, dexterity, strength, Fine motor control, Gross motor control, decreased knowledge of use of DME, and UE functional use, cognitive skills including emotional, and psychosocial skills including coping strategies and routines and behaviors.   IMPAIRMENTS: are limiting patient from ADLs, IADLs, work, and leisure.   CO-MORBIDITIES: has no other co-morbidities that affects occupational performance. Patient will benefit from skilled OT to address above impairments and improve overall function.  MODIFICATION OR ASSISTANCE TO COMPLETE EVALUATION: No modification of tasks or  assist necessary to complete an evaluation.  OT OCCUPATIONAL PROFILE AND HISTORY: Problem focused assessment: Including review of records relating to presenting problem.  CLINICAL DECISION MAKING: Moderate - several treatment options, min-mod task modification necessary  REHAB POTENTIAL: Good  EVALUATION COMPLEXITY: Moderate    PLAN:  OT FREQUENCY: 2x/week  OT DURATION:  12 weeks  PLANNED INTERVENTIONS: self care/ADL training, therapeutic exercise, therapeutic activity, neuromuscular re-education, manual therapy, passive range of motion, moist heat, cryotherapy, contrast bath, patient/family education, coping strategies training, and DME and/or AE instructions  RECOMMENDED OTHER SERVICES: None at this time  CONSULTED AND AGREED WITH PLAN OF CARE: Patient  PLAN FOR NEXT SESSION: HEP review, neuro re-ed and therapeutic exercises  Leta Speller, MS, OTR/L  Darleene Cleaver, OT 10/06/2022, 8:57 AM

## 2022-10-07 ENCOUNTER — Ambulatory Visit: Payer: 59

## 2022-10-12 ENCOUNTER — Ambulatory Visit: Payer: 59

## 2022-10-19 ENCOUNTER — Ambulatory Visit: Payer: 59 | Attending: Student

## 2022-10-19 DIAGNOSIS — R278 Other lack of coordination: Secondary | ICD-10-CM | POA: Insufficient documentation

## 2022-10-19 DIAGNOSIS — M6281 Muscle weakness (generalized): Secondary | ICD-10-CM | POA: Insufficient documentation

## 2022-10-21 ENCOUNTER — Ambulatory Visit: Payer: 59

## 2022-10-21 DIAGNOSIS — M6281 Muscle weakness (generalized): Secondary | ICD-10-CM | POA: Diagnosis not present

## 2022-10-21 DIAGNOSIS — R278 Other lack of coordination: Secondary | ICD-10-CM | POA: Diagnosis not present

## 2022-10-22 ENCOUNTER — Ambulatory Visit: Payer: 59

## 2022-10-22 DIAGNOSIS — R278 Other lack of coordination: Secondary | ICD-10-CM | POA: Diagnosis not present

## 2022-10-22 DIAGNOSIS — M6281 Muscle weakness (generalized): Secondary | ICD-10-CM

## 2022-10-22 NOTE — Therapy (Signed)
OUTPATIENT OCCUPATIONAL THERAPY NEURO TREATMENT NOTE  Patient Name: Eric Austin MRN: [id number] DOB:1961/01/22, 62 y.o., male Today's Date: 10/22/2022  PCP: Dr. Einar Crow REFERRING PROVIDER: Dr. Almon Hercules  END OF SESSION:  OT End of Session - 10/22/22 2053     Visit Number 3    Number of Visits 24    Date for OT Re-Evaluation 12/24/22    Progress Note Due on Visit 10    OT Start Time 0845    OT Stop Time 0930    OT Time Calculation (min) 45 min    Equipment Utilized During Treatment none    Activity Tolerance Patient tolerated treatment well    Behavior During Therapy [facility] for tasks assessed/performed            No past medical history on file. Past Surgical History:  Procedure Laterality Date   COLONOSCOPY WITH PROPOFOL N/A 07/25/2020   Procedure: COLONOSCOPY WITH PROPOFOL;  Surgeon: Wyline Mood, MD;  Location: [facility] ENDOSCOPY;  Service: Gastroenterology;  Laterality: N/A;   HERNIA REPAIR     kidney removed     Patient Active Problem List   Diagnosis Date Noted   Primary hypertension 11/25/2021   Right foot pain 11/25/2021   Pulmonary nodule 09/29/2021   History of diverticulitis of colon 06/11/2020   Annual physical exam 05/15/2020   BPH without obstruction/lower urinary tract symptoms 05/15/2020   Primary osteoarthritis of right knee 05/15/2020   Irritable bowel syndrome with diarrhea 05/15/2020    ONSET DATE: Feb. 2024  REFERRING DIAG: I63.9 (ICD-10-CM) - Cerebral infarction, unspecified   THERAPY DIAG:  Muscle weakness (generalized)  Other lack of coordination  Rationale for Evaluation and Treatment: Rehabilitation  SUBJECTIVE:  SUBJECTIVE STATEMENT: Pt reports doing well but R hand is feeling stiff. Pt accompanied by: significant other  PERTINENT HISTORY:  Per [facility] chart, Eric Austin is a 62 y.o. male with a past medical history of HTN who was transferred to [facility] on 09/03/2022 with acute stroke affecting left pyramidal tract.  Initially, PT/OT recommended discharge to [facility]. However, his condition improved and ultimately PT/OT/ST recommended outpatient treatment 3x weekly.   PRECAUTIONS: None  WEIGHT BEARING RESTRICTIONS: No  PAIN:  Are you having pain? no  FALLS: Has patient fallen in last 6 months? No  LIVING ENVIRONMENT: Lives with: lives with their spouse Lives in: 1 level home Stairs: Yes: External: 4 steps; on right going up Has following equipment at home:  was using a 4 pronged cane but no longer having to use it, built in shower seat   PLOF: Independent, full time landscaping  PATIENT GOALS: Get my right hand stronger and be able to write my name and eat with my right hand. OBJECTIVE:   HAND DOMINANCE: Right  ADLs: Overall ADLs: extra time; difficulty using the R hand Transfers/ambulation related to ADLs: indep with dressing/bathing Eating: just started eating with the R hand but with difficulty Grooming: pt tries a little with the R, but has to use L mostly UB Dressing: difficulty with clothing fasteners LB Dressing: difficulty with clothing fasteners Toileting: indep Bathing: indep Tub Shower transfers: indep Equipment:  see above  IADLs: Shopping: supv Light housekeeping: with effort Meal Prep: cooking on the [employer] mobility: ambulatory without AD Medication management: supv, occasional reminders Financial management: indep Handwriting: 100% legible and Increased time, pt reports almost to his baseline with handwriting.   MOBILITY STATUS:  ambulatory without AD  POSTURE COMMENTS:  No Significant postural limitations Sitting balance: Moves/returns truncal  midpoint >2 inches in all planes  ACTIVITY TOLERANCE: Activity tolerance: WFL for assessment activities  FUNCTIONAL OUTCOME MEASURES: FOTO: 65; predicted 73  UPPER EXTREMITY ROM:  BUEs WFL  UPPER EXTREMITY MMT:     MMT Right eval Left eval  Shoulder flexion 4- 5  Shoulder abduction 4- 5  Shoulder  adduction    Shoulder extension    Shoulder internal rotation    Shoulder external rotation    Middle trapezius    Lower trapezius    Elbow flexion 4+ 5  Elbow extension 4+ 5  Wrist flexion 4+ 5  Wrist extension 4+ 5  Wrist ulnar deviation    Wrist radial deviation    Wrist pronation    Wrist supination    (Blank rows = not tested)  HAND FUNCTION: Grip strength: Right: 37 lbs; Left: 64 lbs, Lateral pinch: Right: 11 lbs, Left: 18 lbs, and 3 point pinch: Right: 13 lbs, Left: 20 lbs  COORDINATION: Finger Nose Finger test: good, slightly slower than L 9 Hole Peg test: Right: 35 sec; Left: 25 sec  SENSATION: WFL Light touch: WFL  EDEMA: none  MUSCLE TONE: RUE: Within functional limits  COGNITION: Overall cognitive status: mood lability; pt intermittently tearful x2 during eval  VISION: Subjective report: Pt reports that he wears hard contacts and only put a contact in the L eye today d/t difficulty.  Pt reports denies any changes in vision since CVA.  VISION ASSESSMENT: Ocular ROM: WFL Tracking/Visual pursuits: Able to track stimulus in all quads without difficulty Saccades: WFL Visual Fields: no apparent deficits  PERCEPTION: WFL  PRAXIS: Impaired: Motor planning  OBSERVATIONS: Pt making good efforts to engage the R hand for all assessment activities.   TODAY'S TREATMENT:                                                                                                                              Therapeutic Exercise: Performed passive stretching to R wrist and digits for flex/ext at each joint, working to decrease stiffness and maximize full composite fist.  Instructed pt in self passive stretching techniques with good return demo, and encouraged pt begin stretching hand several times throughout the day.  Encouraged return to use of theraputty for gripping and pinching rather than use of pt's hand held grip strengthening ring as OT anticipates pt is overusing this as he  reports he carries it around and squeezes it wherever he goes.  Pt verbalized understanding of incorporating rest and stretch into daily routine.  Facilitated hand strengthening with use of hand gripper set at 11.2# to remove jumbo pegs from pegboard x2 trials using R hand. Pt required intermittent assist to reposition hand gripper in hand to ensure R fifth digit was engaged in each gripping rep.  Facilitated R forearm, wrist, and hand strengthening with participation in EZ board tools.  Pt worked with long handled tool to facilitate R wrist flex/ext, small and large base key turn, and  small and large dial turn, x3 reps for each tool (up/down board=1 rep).  Rest breaks between sets and min vc for technique to maximize ROM with each tool rotation against wide strip of velcro resistance, with intermittent min guard to maintain tool level on board.    PATIENT EDUCATION: Education details: red theraband exercises for RUE strengthening Person educated: Patient Education method: Explanation and Verbal cues, demo, handout Education comprehension: verbalized understanding, returned demonstration, verbal cues required, and needs further education  HOME EXERCISE PROGRAM: Green medium resistance theraputty, red theraband exercises   GOALS: Goals reviewed with patient? Yes  SHORT TERM GOALS: Target date: 11/12/22 (6 weeks)  Pt will be indep to perform HEP for improving strength and coordination throughout the RUE. Baseline: Eval: initiate putty exercises at eval, further training needed Goal status: INITIAL  LONG TERM GOALS: Target date: 12/24/22 (12 weeks)  Pt will increase FOTO score to 73 or better to indicate improvement in self perceived functional use of the R arm with daily tasks. Baseline: Eval: 65 Goal status: INITIAL  2.  Pt will increase R grip strength by 20 or more lbs in order to improve ability to hold and carry ADL supplies.   Baseline: Eval: R 37 (L 64) Goal status: INITIAL  3.  Pt  will improve FMC/dexterity skills in R  hand to be able to pick up coins or pills from a table with greater ease.  Baseline: Eval: with difficulty/increased time (9 hole peg test R 35 sec, L 25 sec) Goal status: INITIAL  4.  Pt will use the R hand to eat with good accuracy and adapted utensils as needed. Baseline: Eval: pt just started eating with the R hand, but with effort and some mess Goal status: INITIAL  5.  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  Baseline: R shoulder 4-, elbow and wrist 4+ (See LTG #2 for R hand strength); LUE 5/5 throughout  Goal status: INITIAL  ASSESSMENT: CLINICAL IMPRESSION: Pt reporting increased stiffness in R hand this date.  Instructed pt in self passive stretching techniques with good return demo, and encouraged pt begin stretching hand several times throughout the day.  Encouraged return to use of theraputty for gripping and pinching rather than use of pt's hand held grip strengthening ring as OT anticipates pt is overusing this as he reports he carries it around and squeezes it wherever he goes.  Grip strength was also measured today and actually was weaker than eval (~20 lbs compared to 37#), which also is likely to be a result of pt over-gripping throughout the day with his ring.  Pt verbalized understanding of incorporating rest and stretch into daily routine.  Pt continues to report consistent attempts to use the R hand for daily tasks, and stated that he was able to cut his steak with the knife in his R hand, using the R IF on top of the knife for stability.  Pt continues to present with weakness throughout the RUE.  Pt will continue to benefit from skilled OT to address RUE weakness and coordination deficits in order to maximize indep and use of the R dominant arm for daily tasks, improve function for return to work, and improve QOL.  PERFORMANCE DEFICITS: in functional  skills including ADLs, IADLs, coordination, dexterity, strength, Fine motor control, Gross motor control, decreased knowledge of use of DME, and UE functional use, cognitive skills including emotional, and psychosocial skills  including coping strategies and routines and behaviors.   IMPAIRMENTS: are limiting patient from ADLs, IADLs, work, and leisure.   CO-MORBIDITIES: has no other co-morbidities that affects occupational performance. Patient will benefit from skilled OT to address above impairments and improve overall function.  MODIFICATION OR ASSISTANCE TO COMPLETE EVALUATION: No modification of tasks or assist necessary to complete an evaluation.  OT OCCUPATIONAL PROFILE AND HISTORY: Problem focused assessment: Including review of records relating to presenting problem.  CLINICAL DECISION MAKING: Moderate - several treatment options, min-mod task modification necessary  REHAB POTENTIAL: Good  EVALUATION COMPLEXITY: Moderate    PLAN:  OT FREQUENCY: 2x/week  OT DURATION: 12 weeks  PLANNED INTERVENTIONS: self care/ADL training, therapeutic exercise, therapeutic activity, neuromuscular re-education, manual therapy, passive range of motion, moist heat, cryotherapy, contrast bath, patient/family education, coping strategies training, and DME and/or AE instructions  RECOMMENDED OTHER SERVICES: None at this time  CONSULTED AND AGREED WITH PLAN OF CARE: Patient  PLAN FOR NEXT SESSION: HEP review, neuro re-ed and therapeutic exercises  Danelle Earthly, MS, OTR/L  Otis Dials, OT 10/22/2022, 8:54 PM

## 2022-10-25 NOTE — Therapy (Signed)
OUTPATIENT OCCUPATIONAL THERAPY NEURO TREATMENT NOTE  Patient Name: Eric Austin MRN: [id number] DOB:10-31-1960, 62 y.o., male Today's Date: 10/25/2022  PCP: Dr. Einar Crow REFERRING PROVIDER: Dr. Almon Hercules  END OF SESSION:  OT End of Session - 10/25/22 1619     Visit Number 4    Number of Visits 24    Date for OT Re-Evaluation 12/24/22    Progress Note Due on Visit 10    OT Start Time 1515    OT Stop Time 1600    OT Time Calculation (min) 45 min    Equipment Utilized During Treatment none    Activity Tolerance Patient tolerated treatment well    Behavior During Therapy [facility] for tasks assessed/performed            No past medical history on file. Past Surgical History:  Procedure Laterality Date   COLONOSCOPY WITH PROPOFOL N/A 07/25/2020   Procedure: COLONOSCOPY WITH PROPOFOL;  Surgeon: Wyline Mood, MD;  Location: [facility] ENDOSCOPY;  Service: Gastroenterology;  Laterality: N/A;   HERNIA REPAIR     kidney removed     Patient Active Problem List   Diagnosis Date Noted   Primary hypertension 11/25/2021   Right foot pain 11/25/2021   Pulmonary nodule 09/29/2021   History of diverticulitis of colon 06/11/2020   Annual physical exam 05/15/2020   BPH without obstruction/lower urinary tract symptoms 05/15/2020   Primary osteoarthritis of right knee 05/15/2020   Irritable bowel syndrome with diarrhea 05/15/2020    ONSET DATE: Feb. 2024  REFERRING DIAG: I63.9 (ICD-10-CM) - Cerebral infarction, unspecified   THERAPY DIAG:  Muscle weakness (generalized)  Other lack of coordination  Rationale for Evaluation and Treatment: Rehabilitation  SUBJECTIVE:  SUBJECTIVE STATEMENT: Pt reports R hand is feeling less stiff now that he's stretching regularly throughout the day. Pt accompanied by: significant other  PERTINENT HISTORY:  Per [facility] chart, Eric Austin is a 62 y.o. male with a past medical history of HTN who was transferred to [facility] on 09/03/2022 with  acute stroke affecting left pyramidal tract. Initially, PT/OT recommended discharge to [facility]. However, his condition improved and ultimately PT/OT/ST recommended outpatient treatment 3x weekly.   PRECAUTIONS: None  WEIGHT BEARING RESTRICTIONS: No  PAIN:  Are you having pain? no  FALLS: Has patient fallen in last 6 months? No  LIVING ENVIRONMENT: Lives with: lives with their spouse Lives in: 1 level home Stairs: Yes: External: 4 steps; on right going up Has following equipment at home:  was using a 4 pronged cane but no longer having to use it, built in shower seat   PLOF: Independent, full time landscaping  PATIENT GOALS: Get my right hand stronger and be able to write my name and eat with my right hand. OBJECTIVE:   HAND DOMINANCE: Right  ADLs: Overall ADLs: extra time; difficulty using the R hand Transfers/ambulation related to ADLs: indep with dressing/bathing Eating: just started eating with the R hand but with difficulty Grooming: pt tries a little with the R, but has to use L mostly UB Dressing: difficulty with clothing fasteners LB Dressing: difficulty with clothing fasteners Toileting: indep Bathing: indep Tub Shower transfers: indep Equipment:  see above  IADLs: Shopping: supv Light housekeeping: with effort Meal Prep: cooking on the [employer] mobility: ambulatory without AD Medication management: supv, occasional reminders Financial management: indep Handwriting: 100% legible and Increased time, pt reports almost to his baseline with handwriting.   MOBILITY STATUS:  ambulatory without AD  POSTURE COMMENTS:  No Significant  postural limitations Sitting balance: Moves/returns truncal midpoint >2 inches in all planes  ACTIVITY TOLERANCE: Activity tolerance: WFL for assessment activities  FUNCTIONAL OUTCOME MEASURES: FOTO: 65; predicted 73  UPPER EXTREMITY ROM:  BUEs WFL  UPPER EXTREMITY MMT:     MMT Right eval Left eval  Shoulder flexion 4- 5   Shoulder abduction 4- 5  Shoulder adduction    Shoulder extension    Shoulder internal rotation    Shoulder external rotation    Middle trapezius    Lower trapezius    Elbow flexion 4+ 5  Elbow extension 4+ 5  Wrist flexion 4+ 5  Wrist extension 4+ 5  Wrist ulnar deviation    Wrist radial deviation    Wrist pronation    Wrist supination    (Blank rows = not tested)  HAND FUNCTION: Grip strength: Right: 37 lbs; Left: 64 lbs, Lateral pinch: Right: 11 lbs, Left: 18 lbs, and 3 point pinch: Right: 13 lbs, Left: 20 lbs  COORDINATION: Finger Nose Finger test: good, slightly slower than L 9 Hole Peg test: Right: 35 sec; Left: 25 sec  SENSATION: WFL Light touch: WFL  EDEMA: none  MUSCLE TONE: RUE: Within functional limits  COGNITION: Overall cognitive status: mood lability; pt intermittently tearful x2 during eval  VISION: Subjective report: Pt reports that he wears hard contacts and only put a contact in the L eye today d/t difficulty.  Pt reports denies any changes in vision since CVA.  VISION ASSESSMENT: Ocular ROM: WFL Tracking/Visual pursuits: Able to track stimulus in all quads without difficulty Saccades: WFL Visual Fields: no apparent deficits  PERCEPTION: WFL  PRAXIS: Impaired: Motor planning  OBSERVATIONS: Pt making good efforts to engage the R hand for all assessment activities.   TODAY'S TREATMENT:                                                                                                                              Therapeutic Exercise: Performed passive stretching to R wrist and digits for flex/ext at each joint, working to decrease stiffness and maximize full composite fist.  Facilitated hand strengthening with use of hand gripper set at 6.6# for 1 trial, 11.2# for a 2nd trial, and 17.9# for a 3rd trial to remove jumbo pegs from pegboard using R hand.  Pt required intermittent assist to reposition hand gripper in hand to ensure R fifth digit was  engaged in each gripping rep.  Reviewed theraband exercises for R shoulder and elbow planes.  Able to progress from red band to green band today for horiz abd, shoulder flex, and elbow flex and ext.  Min vc for technique for above noted exercises.  Facilitated pinch strengthening with use of therapy resistant clothespins to target lateral and 3 point pinch of R hand.  Pt completed 1 trial of each color for lateral and 3 point pinch, clipping pins onto a vertical dowel.  Further challenged shoulder flexion with pt removing clips from  an elevated dowel positioned above shoulder level.    Neuro re-ed: Facilitated R hand FMC/dexterity skills working to placed grooved pegs into pegboard.  Pt was challenged with removing a row of pegs from pegboard, storing in hand, and discarding pegs 1 at a time moving pegs from palm to fingertips.   PATIENT EDUCATION: Education details: [place] Si theraband exercises for RUE strengthening Person educated: Patient Education method: Explanation and Verbal cues, demo, handout Education comprehension: verbalized understanding, returned demonstration, verbal cues required, and needs further education  HOME EXERCISE PROGRAM: Green medium resistance theraputty, red theraband exercises   GOALS: Goals reviewed with patient? Yes  SHORT TERM GOALS: Target date: 11/12/22 (6 weeks)  Pt will be indep to perform HEP for improving strength and coordination throughout the RUE. Baseline: Eval: initiate putty exercises at eval, further training needed Goal status: INITIAL  LONG TERM GOALS: Target date: 12/24/22 (12 weeks)  Pt will increase FOTO score to 73 or better to indicate improvement in self perceived functional use of the R arm with daily tasks. Baseline: Eval: 65 Goal status: INITIAL  2.  Pt will increase R grip strength by 20 or more lbs in order to improve ability to hold and carry ADL supplies.   Baseline: Eval: R 37 (L 64) Goal status: INITIAL  3.  Pt will improve  FMC/dexterity skills in R  hand to be able to pick up coins or pills from a table with greater ease.  Baseline: Eval: with difficulty/increased time (9 hole peg test R 35 sec, L 25 sec) Goal status: INITIAL  4.  Pt will use the R hand to eat with good accuracy and adapted utensils as needed. Baseline: Eval: pt just started eating with the R hand, but with effort and some mess Goal status: INITIAL  5.  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  Baseline: R shoulder 4-, elbow and wrist 4+ (See LTG #2 for R hand strength); LUE 5/5 throughout  Goal status: INITIAL  ASSESSMENT: CLINICAL IMPRESSION: Pt reporting decreased R hand stiffness now that he is regularly stretching wrist and fingers throughout the day.  Pt was able to progress resistance level to 17.9 lbs on hand gripper today to remove jumbo pegs from pegboard on 3rd trial today, and able to progress from red theraband to green for increased resistance with elbow and shoulder planes.  Pt required min-mod vc today to place grooved pegs into pegboard with cues for manipulating pegs within fingertips.  Pt dropped items frequently when trying to move a peg from palm to fingertips, and has to compensate by shaking hand to move the pegs to fingertips.  Pt will continue to benefit from skilled OT to address RUE weakness and coordination deficits in order to maximize indep and use of the R dominant arm for daily tasks, improve function for return to work, and improve QOL.  PERFORMANCE DEFICITS: in functional skills including ADLs, IADLs, coordination, dexterity, strength, Fine motor control, Gross motor control, decreased knowledge of use of DME, and UE functional use, cognitive skills including emotional, and psychosocial skills including coping strategies and routines and behaviors.   IMPAIRMENTS: are limiting patient from ADLs, IADLs, work, and leisure.    CO-MORBIDITIES: has no other co-morbidities that affects occupational performance. Patient will benefit from skilled OT to address above impairments and improve overall function.  MODIFICATION OR ASSISTANCE TO COMPLETE EVALUATION: No modification of tasks or assist necessary to complete an  evaluation.  OT OCCUPATIONAL PROFILE AND HISTORY: Problem focused assessment: Including review of records relating to presenting problem.  CLINICAL DECISION MAKING: Moderate - several treatment options, min-mod task modification necessary  REHAB POTENTIAL: Good  EVALUATION COMPLEXITY: Moderate    PLAN:  OT FREQUENCY: 2x/week  OT DURATION: 12 weeks  PLANNED INTERVENTIONS: self care/ADL training, therapeutic exercise, therapeutic activity, neuromuscular re-education, manual therapy, passive range of motion, moist heat, cryotherapy, contrast bath, patient/family education, coping strategies training, and DME and/or AE instructions  RECOMMENDED OTHER SERVICES: None at this time  CONSULTED AND AGREED WITH PLAN OF CARE: Patient  PLAN FOR NEXT SESSION: HEP review, neuro re-ed and therapeutic exercises  Danelle Earthly, MS, OTR/L  Otis Dials, OT 10/25/2022, 4:21 PM

## 2022-10-26 ENCOUNTER — Ambulatory Visit: Payer: 59

## 2022-10-26 DIAGNOSIS — M6281 Muscle weakness (generalized): Secondary | ICD-10-CM

## 2022-10-26 DIAGNOSIS — R278 Other lack of coordination: Secondary | ICD-10-CM

## 2022-10-26 NOTE — Therapy (Signed)
OUTPATIENT OCCUPATIONAL THERAPY NEURO TREATMENT NOTE  Patient Name: Eric Austin MRN: [id number] DOB:December 23, 1960, 62 y.o., male Today's Date: 10/26/2022  PCP: Dr. Einar Crow REFERRING PROVIDER: Dr. Almon Hercules  END OF SESSION:  OT End of Session - 10/26/22 1158     Visit Number 5    Number of Visits 24    Date for OT Re-Evaluation 12/24/22    Progress Note Due on Visit 10    OT Start Time 1145    OT Stop Time 1230    OT Time Calculation (min) 45 min    Equipment Utilized During Treatment none    Activity Tolerance Patient tolerated treatment well    Behavior During Therapy [facility] for tasks assessed/performed            No past medical history on file. Past Surgical History:  Procedure Laterality Date   COLONOSCOPY WITH PROPOFOL N/A 07/25/2020   Procedure: COLONOSCOPY WITH PROPOFOL;  Surgeon: Wyline Mood, MD;  Location: [facility] ENDOSCOPY;  Service: Gastroenterology;  Laterality: N/A;   HERNIA REPAIR     kidney removed     Patient Active Problem List   Diagnosis Date Noted   Primary hypertension 11/25/2021   Right foot pain 11/25/2021   Pulmonary nodule 09/29/2021   History of diverticulitis of colon 06/11/2020   Annual physical exam 05/15/2020   BPH without obstruction/lower urinary tract symptoms 05/15/2020   Primary osteoarthritis of right knee 05/15/2020   Irritable bowel syndrome with diarrhea 05/15/2020    ONSET DATE: Feb. 2024  REFERRING DIAG: I63.9 (ICD-10-CM) - Cerebral infarction, unspecified   THERAPY DIAG:  Muscle weakness (generalized)  Other lack of coordination  Rationale for Evaluation and Treatment: Rehabilitation  SUBJECTIVE:  SUBJECTIVE STATEMENT: Pt reports his hand continues to feel stiff but always feels better after stretching it, especially at the R IF.  Pt accompanied by: significant other  PERTINENT HISTORY:  Per [facility] chart, Eric Austin is a 62 y.o. male with a past medical history of HTN who was transferred to [facility] on  09/03/2022 with acute stroke affecting left pyramidal tract. Initially, PT/OT recommended discharge to [facility]. However, his condition improved and ultimately PT/OT/ST recommended outpatient treatment 3x weekly.   PRECAUTIONS: None  WEIGHT BEARING RESTRICTIONS: No  PAIN:  Are you having pain? no  FALLS: Has patient fallen in last 6 months? No  LIVING ENVIRONMENT: Lives with: lives with their spouse Lives in: 1 level home Stairs: Yes: External: 4 steps; on right going up Has following equipment at home:  was using a 4 pronged cane but no longer having to use it, built in shower seat   PLOF: Independent, full time landscaping  PATIENT GOALS: Get my right hand stronger and be able to write my name and eat with my right hand. OBJECTIVE:   HAND DOMINANCE: Right  ADLs: Overall ADLs: extra time; difficulty using the R hand Transfers/ambulation related to ADLs: indep with dressing/bathing Eating: just started eating with the R hand but with difficulty Grooming: pt tries a little with the R, but has to use L mostly UB Dressing: difficulty with clothing fasteners LB Dressing: difficulty with clothing fasteners Toileting: indep Bathing: indep Tub Shower transfers: indep Equipment:  see above  IADLs: Shopping: supv Light housekeeping: with effort Meal Prep: cooking on the [employer] mobility: ambulatory without AD Medication management: supv, occasional reminders Financial management: indep Handwriting: 100% legible and Increased time, pt reports almost to his baseline with handwriting.   MOBILITY STATUS:  ambulatory without AD  POSTURE COMMENTS:  No Significant postural limitations Sitting balance: Moves/returns truncal midpoint >2 inches in all planes  ACTIVITY TOLERANCE: Activity tolerance: WFL for assessment activities  FUNCTIONAL OUTCOME MEASURES: FOTO: 65; predicted 73  UPPER EXTREMITY ROM:  BUEs WFL  UPPER EXTREMITY MMT:     MMT Right eval Left eval   Shoulder flexion 4- 5  Shoulder abduction 4- 5  Shoulder adduction    Shoulder extension    Shoulder internal rotation    Shoulder external rotation    Middle trapezius    Lower trapezius    Elbow flexion 4+ 5  Elbow extension 4+ 5  Wrist flexion 4+ 5  Wrist extension 4+ 5  Wrist ulnar deviation    Wrist radial deviation    Wrist pronation    Wrist supination    (Blank rows = not tested)  HAND FUNCTION: Grip strength: Right: 37 lbs; Left: 64 lbs, Lateral pinch: Right: 11 lbs, Left: 18 lbs, and 3 point pinch: Right: 13 lbs, Left: 20 lbs  COORDINATION: Finger Nose Finger test: good, slightly slower than L 9 Hole Peg test: Right: 35 sec; Left: 25 sec  SENSATION: WFL Light touch: WFL  EDEMA: none  MUSCLE TONE: RUE: Within functional limits  COGNITION: Overall cognitive status: mood lability; pt intermittently tearful x2 during eval  VISION: Subjective report: Pt reports that he wears hard contacts and only put a contact in the L eye today d/t difficulty.  Pt reports denies any changes in vision since CVA.  VISION ASSESSMENT: Ocular ROM: WFL Tracking/Visual pursuits: Able to track stimulus in all quads without difficulty Saccades: WFL Visual Fields: no apparent deficits  PERCEPTION: WFL  PRAXIS: Impaired: Motor planning  OBSERVATIONS: Pt making good efforts to engage the R hand for all assessment activities.   TODAY'S TREATMENT:                                                                                                                              Therapeutic Exercise: Performed passive stretching to R wrist and digits for flex/ext at each joint, working to decrease stiffness and maximize full composite fist.  Facilitated hand strengthening with use of hand gripper set at 11.2# for 1 trial and 17.9# for 2 more trials to remove jumbo pegs from pegboard using R hand.  Pt required intermittent assist to reposition hand gripper in hand to ensure R fifth digit and R  IF were engaged in each gripping rep.  Facilitated pinch strengthening with use of therapy resistant clothespins to target lateral and 3 point pinch of R hand using only medium and strong resistant pins (green, blue, black).  Pt completed 1 trial of each color for lateral and 3 point pinch, clipping pins onto a dowel.  Further challenged shoulder flexion with pt removing clips from an elevated dowel positioned above shoulder level.    Therapeutic Activity: Focus on R hand FMC/dexterity skills.  Pt worked with jumbo pegs, gathering 1 at a  time and storing up to 4 in hand, then worked on translatory skills to move the pegs from palm to fingertips in prep for discarding them 1 at a time into pegboard without dropping the others.  Pt practiced rotating pegs 180 degrees within fingertips, resting forearm on tabletop to isolate finger movements from forearm.  Extra time needed and vc and demo for technique.   PATIENT EDUCATION: Education details: Review of self passive stretching between heavy gripping activities  Person educated: Patient Education method: Explanation and Verbal cues, demo, tactile cues Education comprehension: verbalized understanding, returned demonstration, verbal cues required, and needs further education  HOME EXERCISE PROGRAM: Green medium resistance theraputty, red theraband exercises   GOALS: Goals reviewed with patient? Yes  SHORT TERM GOALS: Target date: 11/12/22 (6 weeks)  Pt will be indep to perform HEP for improving strength and coordination throughout the RUE. Baseline: Eval: initiate putty exercises at eval, further training needed Goal status: INITIAL  LONG TERM GOALS: Target date: 12/24/22 (12 weeks)  Pt will increase FOTO score to 73 or better to indicate improvement in self perceived functional use of the R arm with daily tasks. Baseline: Eval: 65 Goal status: INITIAL  2.  Pt will increase R grip strength by 20 or more lbs in order to improve ability to hold  and carry ADL supplies.   Baseline: Eval: R 37 (L 64) Goal status: INITIAL  3.  Pt will improve FMC/dexterity skills in R  hand to be able to pick up coins or pills from a table with greater ease.  Baseline: Eval: with difficulty/increased time (9 hole peg test R 35 sec, L 25 sec) Goal status: INITIAL  4.  Pt will use the R hand to eat with good accuracy and adapted utensils as needed. Baseline: Eval: pt just started eating with the R hand, but with effort and some mess Goal status: INITIAL  5.  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  Baseline: R shoulder 4-, elbow and wrist 4+ (See LTG #2 for R hand strength); LUE 5/5 throughout  Goal status: INITIAL  ASSESSMENT: CLINICAL IMPRESSION: Pt reports his hand continues to feel stiff but always feels better after stretching it, especially at the R IF.   Pt tolerated 17.9 lbs on hand gripper today to remove jumbo pegs from pegboard on 2nd and 3rd trial today (warm up with 11.2 lbs).  Focussed on R hand FMC/dexterity skills with manipulation of jumbo pegs in hand, including pick up/storage/translatory movements/rotating pegs within fingertips.  Pt requires extra time to coordinate these movements in the R hand, with occasional dropping.  Pt will continue to benefit from skilled OT to address RUE weakness and coordination deficits in order to maximize indep and use of the R dominant arm for daily tasks, improve function for return to work, and improve QOL.  PERFORMANCE DEFICITS: in functional skills including ADLs, IADLs, coordination, dexterity, strength, Fine motor control, Gross motor control, decreased knowledge of use of DME, and UE functional use, cognitive skills including emotional, and psychosocial skills including coping strategies and routines and behaviors.   IMPAIRMENTS: are limiting patient from ADLs, IADLs, work, and leisure.   CO-MORBIDITIES: has no  other co-morbidities that affects occupational performance. Patient will benefit from skilled OT to address above impairments and improve overall function.  MODIFICATION OR ASSISTANCE TO COMPLETE EVALUATION: No modification of tasks or assist necessary to complete an evaluation.  OT OCCUPATIONAL PROFILE  AND HISTORY: Problem focused assessment: Including review of records relating to presenting problem.  CLINICAL DECISION MAKING: Moderate - several treatment options, min-mod task modification necessary  REHAB POTENTIAL: Good  EVALUATION COMPLEXITY: Moderate    PLAN:  OT FREQUENCY: 2x/week  OT DURATION: 12 weeks  PLANNED INTERVENTIONS: self care/ADL training, therapeutic exercise, therapeutic activity, neuromuscular re-education, manual therapy, passive range of motion, moist heat, cryotherapy, contrast bath, patient/family education, coping strategies training, and DME and/or AE instructions  RECOMMENDED OTHER SERVICES: None at this time  CONSULTED AND AGREED WITH PLAN OF CARE: Patient  PLAN FOR NEXT SESSION: HEP review, neuro re-ed and therapeutic exercises  Danelle Earthly, MS, OTR/L  Otis Dials, OT 10/26/2022, 12:14 PM

## 2022-10-29 ENCOUNTER — Encounter: Payer: 59 | Admitting: Occupational Therapy

## 2022-10-29 DIAGNOSIS — I639 Cerebral infarction, unspecified: Secondary | ICD-10-CM | POA: Diagnosis not present

## 2022-10-29 DIAGNOSIS — I10 Essential (primary) hypertension: Secondary | ICD-10-CM | POA: Diagnosis not present

## 2022-11-02 ENCOUNTER — Ambulatory Visit: Payer: 59

## 2022-11-02 DIAGNOSIS — M6281 Muscle weakness (generalized): Secondary | ICD-10-CM | POA: Diagnosis not present

## 2022-11-02 DIAGNOSIS — R278 Other lack of coordination: Secondary | ICD-10-CM | POA: Diagnosis not present

## 2022-11-02 NOTE — Therapy (Signed)
OUTPATIENT OCCUPATIONAL THERAPY NEURO TREATMENT NOTE  Patient Name: Eric Austin MRN: [id number] DOB:12-18-1960, 62 y.o., male Today's Date: 11/02/2022  PCP: Dr. Einar Crow REFERRING PROVIDER: Dr. Almon Hercules  END OF SESSION:  OT End of Session - 11/02/22 1636     Visit Number 6    Number of Visits 24    Date for OT Re-Evaluation 12/24/22    Progress Note Due on Visit 10    OT Start Time 1640    OT Stop Time 1725    OT Time Calculation (min) 45 min    Equipment Utilized During Treatment none    Activity Tolerance Patient tolerated treatment well    Behavior During Therapy [facility] for tasks assessed/performed            History reviewed. No pertinent past medical history. Past Surgical History:  Procedure Laterality Date   COLONOSCOPY WITH PROPOFOL N/A 07/25/2020   Procedure: COLONOSCOPY WITH PROPOFOL;  Surgeon: Wyline Mood, MD;  Location: [facility] ENDOSCOPY;  Service: Gastroenterology;  Laterality: N/A;   HERNIA REPAIR     kidney removed     Patient Active Problem List   Diagnosis Date Noted   Primary hypertension 11/25/2021   Right foot pain 11/25/2021   Pulmonary nodule 09/29/2021   History of diverticulitis of colon 06/11/2020   Annual physical exam 05/15/2020   BPH without obstruction/lower urinary tract symptoms 05/15/2020   Primary osteoarthritis of right knee 05/15/2020   Irritable bowel syndrome with diarrhea 05/15/2020    ONSET DATE: Feb. 2024  REFERRING DIAG: I63.9 (ICD-10-CM) - Cerebral infarction, unspecified   THERAPY DIAG:  Muscle weakness (generalized)  Other lack of coordination  Rationale for Evaluation and Treatment: Rehabilitation  SUBJECTIVE:  SUBJECTIVE STATEMENT: Pt reports he was trimming bushes this weekend. Continues to endorse palm pain when he wakes up in the mornings.  Pt accompanied by: significant other  PERTINENT HISTORY:  Per [facility] chart, [name] is a 62 y.o. male with a past medical history of HTN who was  transferred to [facility] on 09/03/2022 with acute stroke affecting left pyramidal tract. Initially, PT/OT recommended discharge to [facility]. However, his condition improved and ultimately PT/OT/ST recommended outpatient treatment 3x weekly.   PRECAUTIONS: None  WEIGHT BEARING RESTRICTIONS: No  PAIN:  Are you having pain? no  FALLS: Has patient fallen in last 6 months? No  LIVING ENVIRONMENT: Lives with: lives with their spouse Lives in: 1 level home Stairs: Yes: External: 4 steps; on right going up Has following equipment at home:  was using a 4 pronged cane but no longer having to use it, built in shower seat   PLOF: Independent, full time landscaping  PATIENT GOALS: Get my right hand stronger and be able to write my name and eat with my right hand. OBJECTIVE:   HAND DOMINANCE: Right  ADLs: Overall ADLs: extra time; difficulty using the R hand Transfers/ambulation related to ADLs: indep with dressing/bathing Eating: just started eating with the R hand but with difficulty Grooming: pt tries a little with the R, but has to use L mostly UB Dressing: difficulty with clothing fasteners LB Dressing: difficulty with clothing fasteners Toileting: indep Bathing: indep Tub Shower transfers: indep Equipment:  see above  IADLs: Shopping: supv Light housekeeping: with effort Meal Prep: cooking on the [employer] mobility: ambulatory without AD Medication management: supv, occasional reminders Financial management: indep Handwriting: 100% legible and Increased time, pt reports almost to his baseline with handwriting.   MOBILITY STATUS:  ambulatory without AD  POSTURE COMMENTS:  No Significant postural limitations Sitting balance: Moves/returns truncal midpoint >2 inches in all planes  ACTIVITY TOLERANCE: Activity tolerance: WFL for assessment activities  FUNCTIONAL OUTCOME MEASURES: FOTO: 65; predicted 73  UPPER EXTREMITY ROM:  BUEs WFL  UPPER EXTREMITY MMT:     MMT  Right eval Left eval  Shoulder flexion 4- 5  Shoulder abduction 4- 5  Shoulder adduction    Shoulder extension    Shoulder internal rotation    Shoulder external rotation    Middle trapezius    Lower trapezius    Elbow flexion 4+ 5  Elbow extension 4+ 5  Wrist flexion 4+ 5  Wrist extension 4+ 5  Wrist ulnar deviation    Wrist radial deviation    Wrist pronation    Wrist supination    (Blank rows = not tested)  HAND FUNCTION: Grip strength: Right: 37 lbs; Left: 64 lbs, Lateral pinch: Right: 11 lbs, Left: 18 lbs, and 3 point pinch: Right: 13 lbs, Left: 20 lbs  COORDINATION: Finger Nose Finger test: good, slightly slower than L 9 Hole Peg test: Right: 35 sec; Left: 25 sec  SENSATION: WFL Light touch: WFL  EDEMA: none  MUSCLE TONE: RUE: Within functional limits  COGNITION: Overall cognitive status: mood lability; pt intermittently tearful x2 during eval  VISION: Subjective report: Pt reports that he wears hard contacts and only put a contact in the L eye today d/t difficulty.  Pt reports denies any changes in vision since CVA.  VISION ASSESSMENT: Ocular ROM: WFL Tracking/Visual pursuits: Able to track stimulus in all quads without difficulty Saccades: WFL Visual Fields: no apparent deficits  PERCEPTION: WFL  PRAXIS: Impaired: Motor planning  OBSERVATIONS: Pt making good efforts to engage the R hand for all assessment activities.   TODAY'S TREATMENT:                                                                                                                              Therapeutic Exercise: Performed passive stretching to R wrist and digits for flex/ext at each joint, working to decrease stiffness and maximize full composite fist.  Facilitated hand strengthening with use of hand gripper with 2 red bands for 3 sets x 20 reps. Pt required intermittent assist to reposition hand gripper in hand to ensure R fifth digit and R 2nd digit were engaged in each gripping  rep.  Pt trialed 7lb digi-flex for finger strengthening, difficulty engaging 2nd and 5th digit, completed with use of 1.5lb digi-flex, cues for digit isolation.   Therapeutic Activity: Focus on R hand FMC/dexterity skills. Pt used the Groove Pegboard to focus on translatory skills and storing items in palm. Stores 3 pegs prior to consistently dropping pegs out of ulnar side of palm. Pt used the [employer] Tweezer Dexterity Task to sustain  grasp on the resistive tweezers while grasping this sticks, and moving them from a horizontal position to a vertical position to prepare for placing them  into the pegboard. Pt. Required verbal cues, and cues for visual demonstration for wrist position, and hand pattern when placing them into the pegboard. Pt worked on grasping coins from a tabletop surface, placing them into a resistive container, and pushing them through the slot while isolating his 2nd digit.     PATIENT EDUCATION: Education details: Review of self passive stretching between heavy gripping activities  Person educated: Patient Education method: Explanation and Verbal cues, demo, tactile cues Education comprehension: verbalized understanding, returned demonstration, verbal cues required, and needs further education  HOME EXERCISE PROGRAM: Green medium resistance theraputty, red theraband exercises   GOALS: Goals reviewed with patient? Yes  SHORT TERM GOALS: Target date: 11/12/22 (6 weeks)  Pt will be indep to perform HEP for improving strength and coordination throughout the RUE. Baseline: Eval: initiate putty exercises at eval, further training needed Goal status: INITIAL  LONG TERM GOALS: Target date: 12/24/22 (12 weeks)  Pt will increase FOTO score to 73 or better to indicate improvement in self perceived functional use of the R arm with daily tasks. Baseline: Eval: 65 Goal status: INITIAL  2.  Pt will increase R grip strength by 20 or more lbs in order to improve ability to hold and  carry ADL supplies.   Baseline: Eval: R 37 (L 64) Goal status: INITIAL  3.  Pt will improve FMC/dexterity skills in R  hand to be able to pick up coins or pills from a table with greater ease.  Baseline: Eval: with difficulty/increased time (9 hole peg test R 35 sec, L 25 sec) Goal status: INITIAL  4.  Pt will use the R hand to eat with good accuracy and adapted utensils as needed. Baseline: Eval: pt just started eating with the R hand, but with effort and some mess Goal status: INITIAL  5.  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  Baseline: R shoulder 4-, elbow and wrist 4+ (See LTG #2 for R hand strength); LUE 5/5 throughout  Goal status: INITIAL  ASSESSMENT: CLINICAL IMPRESSION: Focussed on R hand FMC/dexterity skills with manipulation of groove pegboard including pick up/storage/translatory movements/rotating pegs within fingertips. Difficulty storing greater than 3 pegs in hand without dropping. Tolerated gross gripper strengthening using 2 red bands and 1.5lb digi-flex. Improved ability to keep 2nd and 5th digits on gripper. Pt will continue to benefit from skilled OT to address RUE weakness and coordination deficits in order to maximize indep and use of the R dominant arm for daily tasks, improve function for return to work, and improve QOL.  PERFORMANCE DEFICITS: in functional skills including ADLs, IADLs, coordination, dexterity, strength, Fine motor control, Gross motor control, decreased knowledge of use of DME, and UE functional use, cognitive skills including emotional, and psychosocial skills including coping strategies and routines and behaviors.   IMPAIRMENTS: are limiting patient from ADLs, IADLs, work, and leisure.   CO-MORBIDITIES: has no other co-morbidities that affects occupational performance. Patient will benefit from skilled OT to address above impairments and improve overall  function.  MODIFICATION OR ASSISTANCE TO COMPLETE EVALUATION: No modification of tasks or assist necessary to complete an evaluation.  OT OCCUPATIONAL PROFILE AND HISTORY: Problem focused assessment: Including review of records relating to presenting problem.  CLINICAL DECISION MAKING: Moderate - several treatment options, min-mod task modification necessary  REHAB POTENTIAL: Good  EVALUATION COMPLEXITY: Moderate    PLAN:  OT FREQUENCY: 2x/week  OT DURATION: 12 weeks  PLANNED  INTERVENTIONS: self care/ADL training, therapeutic exercise, therapeutic activity, neuromuscular re-education, manual therapy, passive range of motion, moist heat, cryotherapy, contrast bath, patient/family education, coping strategies training, and DME and/or AE instructions  RECOMMENDED OTHER SERVICES: None at this time  CONSULTED AND AGREED WITH PLAN OF CARE: Patient  PLAN FOR NEXT SESSION: HEP review, neuro re-ed and therapeutic exercises  Kathie Dike, M.S. OTR/L  11/02/22, 4:36 PM  ascom [phone]   Presley Raddle, OT 11/02/2022, 4:36 PM

## 2022-11-05 ENCOUNTER — Ambulatory Visit: Payer: 59

## 2022-11-09 ENCOUNTER — Ambulatory Visit: Payer: 59

## 2022-11-09 DIAGNOSIS — M6281 Muscle weakness (generalized): Secondary | ICD-10-CM | POA: Diagnosis not present

## 2022-11-09 DIAGNOSIS — R278 Other lack of coordination: Secondary | ICD-10-CM | POA: Diagnosis not present

## 2022-11-09 NOTE — Therapy (Signed)
OUTPATIENT OCCUPATIONAL THERAPY NEURO TREATMENT NOTE  Patient Name: Eric Austin MRN: [id number] DOB:01-26-1961, 62 y.o., male Today's Date: 11/09/2022  PCP: Dr. Einar Crow REFERRING PROVIDER: Dr. Almon Hercules  END OF SESSION:  OT End of Session - 11/09/22 1434     Visit Number 7    Number of Visits 24    Date for OT Re-Evaluation 12/24/22    Progress Note Due on Visit 10    OT Start Time 1100    OT Stop Time 1145    OT Time Calculation (min) 45 min    Equipment Utilized During Treatment none    Activity Tolerance Patient tolerated treatment well    Behavior During Therapy [facility] for tasks assessed/performed            No past medical history on file. Past Surgical History:  Procedure Laterality Date   COLONOSCOPY WITH PROPOFOL N/A 07/25/2020   Procedure: COLONOSCOPY WITH PROPOFOL;  Surgeon: Wyline Mood, MD;  Location: [facility] ENDOSCOPY;  Service: Gastroenterology;  Laterality: N/A;   HERNIA REPAIR     kidney removed     Patient Active Problem List   Diagnosis Date Noted   Primary hypertension 11/25/2021   Right foot pain 11/25/2021   Pulmonary nodule 09/29/2021   History of diverticulitis of colon 06/11/2020   Annual physical exam 05/15/2020   BPH without obstruction/lower urinary tract symptoms 05/15/2020   Primary osteoarthritis of right knee 05/15/2020   Irritable bowel syndrome with diarrhea 05/15/2020    ONSET DATE: Feb. 2024  REFERRING DIAG: I63.9 (ICD-10-CM) - Cerebral infarction, unspecified   THERAPY DIAG:  Muscle weakness (generalized)  Other lack of coordination  Rationale for Evaluation and Treatment: Rehabilitation  SUBJECTIVE:  SUBJECTIVE STATEMENT: Pt reports his son bought him a glove that fills up with air and helps to stretch the fingers into a fist and then into extension.  Pt reports that he's been wearing this glove for about 30 min each evening and feels like it has been helpful to loosen up and stretch his hand. Pt  accompanied by: significant other  PERTINENT HISTORY:  Per [facility] chart, Eric Austin is a 62 y.o. male with a past medical history of HTN who was transferred to [facility] on 09/03/2022 with acute stroke affecting left pyramidal tract. Initially, PT/OT recommended discharge to [facility]. However, his condition improved and ultimately PT/OT/ST recommended outpatient treatment 3x weekly.   PRECAUTIONS: None  WEIGHT BEARING RESTRICTIONS: No  PAIN:  Are you having pain? no  FALLS: Has patient fallen in last 6 months? No  LIVING ENVIRONMENT: Lives with: lives with their spouse Lives in: 1 level home Stairs: Yes: External: 4 steps; on right going up Has following equipment at home:  was using a 4 pronged cane but no longer having to use it, built in shower seat   PLOF: Independent, full time landscaping  PATIENT GOALS: Get my right hand stronger and be able to write my name and eat with my right hand. OBJECTIVE:   HAND DOMINANCE: Right  ADLs: Overall ADLs: extra time; difficulty using the R hand Transfers/ambulation related to ADLs: indep with dressing/bathing Eating: just started eating with the R hand but with difficulty Grooming: pt tries a little with the R, but has to use L mostly UB Dressing: difficulty with clothing fasteners LB Dressing: difficulty with clothing fasteners Toileting: indep Bathing: indep Tub Shower transfers: indep Equipment:  see above  IADLs: Shopping: supv Light housekeeping: with effort Meal Prep: cooking on the [employer] mobility:  ambulatory without AD Medication management: supv, occasional reminders Financial management: indep Handwriting: 100% legible and Increased time, pt reports almost to his baseline with handwriting.   MOBILITY STATUS:  ambulatory without AD  POSTURE COMMENTS:  No Significant postural limitations Sitting balance: Moves/returns truncal midpoint >2 inches in all planes  ACTIVITY TOLERANCE: Activity tolerance: WFL for  assessment activities  FUNCTIONAL OUTCOME MEASURES: FOTO: 65; predicted 73  UPPER EXTREMITY ROM:  BUEs WFL  UPPER EXTREMITY MMT:     MMT Right eval Left eval  Shoulder flexion 4- 5  Shoulder abduction 4- 5  Shoulder adduction    Shoulder extension    Shoulder internal rotation    Shoulder external rotation    Middle trapezius    Lower trapezius    Elbow flexion 4+ 5  Elbow extension 4+ 5  Wrist flexion 4+ 5  Wrist extension 4+ 5  Wrist ulnar deviation    Wrist radial deviation    Wrist pronation    Wrist supination    (Blank rows = not tested)  HAND FUNCTION: Grip strength: Right: 37 lbs; Left: 64 lbs, Lateral pinch: Right: 11 lbs, Left: 18 lbs, and 3 point pinch: Right: 13 lbs, Left: 20 lbs  COORDINATION: Finger Nose Finger test: good, slightly slower than L 9 Hole Peg test: Right: 35 sec; Left: 25 sec  SENSATION: WFL Light touch: WFL  EDEMA: none  MUSCLE TONE: RUE: Within functional limits  COGNITION: Overall cognitive status: mood lability; pt intermittently tearful x2 during eval  VISION: Subjective report: Pt reports that he wears hard contacts and only put a contact in the L eye today d/t difficulty.  Pt reports denies any changes in vision since CVA.  VISION ASSESSMENT: Ocular ROM: WFL Tracking/Visual pursuits: Able to track stimulus in all quads without difficulty Saccades: WFL Visual Fields: no apparent deficits  PERCEPTION: WFL  PRAXIS: Impaired: Motor planning  OBSERVATIONS: Pt making good efforts to engage the R hand for all assessment activities.   TODAY'S TREATMENT:                                                                                                                              Therapeutic Exercise: Performed passive stretching to R wrist and digits for flex/ext at each joint, working to decrease stiffness and maximize full composite fist.  Facilitated hand strengthening with use of hand gripper set at 7.1# to remove jumbo pegs  from pegboard x3 trials using R hand.  Min vc to keep R hand IF flexed around hand gripper for each gripping rep.  Facilitated pinch strengthening with use of therapy resistant clothespins to target lateral and 3 point pinch of R hand.  Pt worked with the green, blue, and black most resistive pins and completed 2 trials of each color for each pinch pattern noted above.  Pt alternated hands to climb a dowel placed vertically on table top, working to provide increased strength and flexibility with active shoulder flexion.  Pt held position at the top of the dowel; completed 3 reps.  With 5# dowel, pt completed chest press, shoulder press, ER behind head, abd, and IR rotation and shoulder extension behind back x10 reps each, working to increase strength for lift yard tools and equipment.   Therapeutic Activity: Focus on R hand FMC/dexterity skills working to thread a nut onto small, medium, and larger size bolts.  Pt held bolt in L hand while the R hand twisted and untwisted the nut.  Pt then practiced picking up nuts and bolts from table top, 1 at a time, storing in hand, and moving pieces one at a time from palm to fingertips in prep for discarding.    PATIENT EDUCATION: Education details: RUE strengthening and Retail buyer Person educated: Patient Education method: Explanation and Verbal cues, demo, tactile cues Education comprehension: verbalized understanding, returned demonstration, verbal cues required, and needs further education  HOME EXERCISE PROGRAM: Green medium resistance theraputty, red theraband exercises   GOALS: Goals reviewed with patient? Yes  SHORT TERM GOALS: Target date: 11/12/22 (6 weeks)  Pt will be indep to perform HEP for improving strength and coordination throughout the RUE. Baseline: Eval: initiate putty exercises at eval, further training needed Goal status: INITIAL  LONG TERM GOALS: Target date: 12/24/22 (12 weeks)  Pt will increase FOTO score to 73 or  better to indicate improvement in self perceived functional use of the R arm with daily tasks. Baseline: Eval: 65 Goal status: INITIAL  2.  Pt will increase R grip strength by 20 or more lbs in order to improve ability to hold and carry ADL supplies.   Baseline: Eval: R 37 (L 64) Goal status: INITIAL  3.  Pt will improve FMC/dexterity skills in R  hand to be able to pick up coins or pills from a table with greater ease.  Baseline: Eval: with difficulty/increased time (9 hole peg test R 35 sec, L 25 sec) Goal status: INITIAL  4.  Pt will use the R hand to eat with good accuracy and adapted utensils as needed. Baseline: Eval: pt just started eating with the R hand, but with effort and some mess Goal status: INITIAL  5.  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  Baseline: R shoulder 4-, elbow and wrist 4+ (See LTG #2 for R hand strength); LUE 5/5 throughout  Goal status: INITIAL  ASSESSMENT: CLINICAL IMPRESSION: Pt reports his son bought him a glove that fills up with air and helps to stretch the fingers into a fist and then into extension.  Pt reports that he's been wearing this glove for about 30 min each evening and feels like it has been helpful to loosen up and stretch his hand.  Pt tolerated all therapeutic exercises well this date.  Pt stated that he continues to struggle with some of the heavier yard equipment that he typically uses for work, including a weed eater and an extended trimmer than can reach above his head.  Pt stated that some times he can pull the cord to start the weed eater, but the equipment is heavy and he requires frequent rest breaks.  Pt continues to present with weakness throughout the RUE.  With gripping activities, pt requires cues to ensure R IF grips tightly around objects, as he tends to let it relax and lets the thumb and digits 3-5 grasp.  Noted pt to be using his L non-dominant  hand to scroll on his phone and used voice to text to send a text to a family member.  OT encouraged pt use R hand for scrolling and make attempts at typing on phone with R hand when he is not feeling rushed.  Pt verbalized understanding.  Pt will continue to benefit from skilled OT to address RUE weakness and coordination deficits in order to maximize indep and use of the R dominant arm for daily tasks, improve function for return to work, and improve QOL.  PERFORMANCE DEFICITS: in functional skills including ADLs, IADLs, coordination, dexterity, strength, Fine motor control, Gross motor control, decreased knowledge of use of DME, and UE functional use, cognitive skills including emotional, and psychosocial skills including coping strategies and routines and behaviors.   IMPAIRMENTS: are limiting patient from ADLs, IADLs, work, and leisure.   CO-MORBIDITIES: has no other co-morbidities that affects occupational performance. Patient will benefit from skilled OT to address above impairments and improve overall function.  MODIFICATION OR ASSISTANCE TO COMPLETE EVALUATION: No modification of tasks or assist necessary to complete an evaluation.  OT OCCUPATIONAL PROFILE AND HISTORY: Problem focused assessment: Including review of records relating to presenting problem.  CLINICAL DECISION MAKING: Moderate - several treatment options, min-mod task modification necessary  REHAB POTENTIAL: Good  EVALUATION COMPLEXITY: Moderate    PLAN:  OT FREQUENCY: 2x/week  OT DURATION: 12 weeks  PLANNED INTERVENTIONS: self care/ADL training, therapeutic exercise, therapeutic activity, neuromuscular re-education, manual therapy, passive range of motion, moist heat, cryotherapy, contrast bath, patient/family education, coping strategies training, and DME and/or AE instructions  RECOMMENDED OTHER SERVICES: None at this time  CONSULTED AND AGREED WITH PLAN OF CARE: Patient  PLAN FOR NEXT SESSION: HEP review,  neuro re-ed and therapeutic exercises   Otis Dials, OT 11/09/2022, 2:35 PM

## 2022-11-11 ENCOUNTER — Ambulatory Visit: Payer: 59 | Attending: Student

## 2022-11-11 DIAGNOSIS — M6281 Muscle weakness (generalized): Secondary | ICD-10-CM | POA: Diagnosis not present

## 2022-11-11 DIAGNOSIS — R269 Unspecified abnormalities of gait and mobility: Secondary | ICD-10-CM | POA: Diagnosis not present

## 2022-11-11 DIAGNOSIS — R278 Other lack of coordination: Secondary | ICD-10-CM | POA: Diagnosis not present

## 2022-11-11 DIAGNOSIS — R2681 Unsteadiness on feet: Secondary | ICD-10-CM | POA: Diagnosis not present

## 2022-11-11 NOTE — Therapy (Signed)
OUTPATIENT OCCUPATIONAL THERAPY NEURO TREATMENT NOTE  Patient Name: Eric Austin MRN: [id number] DOB:[birth year], 62 y.o., male Today's Date: 11/11/2022  PCP: Dr. Einar Crow REFERRING PROVIDER: Dr. Almon Hercules  END OF SESSION:  OT End of Session - 11/11/22 1108     Visit Number 8    Number of Visits 24    Date for OT Re-Evaluation 12/24/22    Progress Note Due on Visit 10    OT Start Time 1106    OT Stop Time 1145    OT Time Calculation (min) 39 min    Equipment Utilized During Treatment none    Activity Tolerance Patient tolerated treatment well            No past medical history on file. Past Surgical History:  Procedure Laterality Date   COLONOSCOPY WITH PROPOFOL N/A 07/25/2020   Procedure: COLONOSCOPY WITH PROPOFOL;  Surgeon: Wyline Mood, MD;  Location: [facility] ENDOSCOPY;  Service: Gastroenterology;  Laterality: N/A;   HERNIA REPAIR     kidney removed     Patient Active Problem List   Diagnosis Date Noted   Primary hypertension 11/25/2021   Right foot pain 11/25/2021   Pulmonary nodule 09/29/2021   History of diverticulitis of colon 06/11/2020   Annual physical exam 05/15/2020   BPH without obstruction/lower urinary tract symptoms 05/15/2020   Primary osteoarthritis of right knee 05/15/2020   Irritable bowel syndrome with diarrhea 05/15/2020    ONSET DATE: Feb. 2024  REFERRING DIAG: I63.9 (ICD-10-CM) - Cerebral infarction, unspecified   THERAPY DIAG:  Muscle weakness (generalized)  Other lack of coordination  Rationale for Evaluation and Treatment: Rehabilitation  SUBJECTIVE:  SUBJECTIVE STATEMENT: Pt reported that he was able to use his R hand to pull the cord to start his lawn mower yesterday and the pressure washer today.  Pt stated it takes a couple of tries but it's getting easier. Pt accompanied by: significant other  PERTINENT HISTORY:  Per [facility] chart, Eric Austin is a 62 y.o. male with a past medical history of HTN who was  transferred to [facility] on 09/03/2022 with acute stroke affecting left pyramidal tract. Initially, PT/OT recommended discharge to [facility]. However, his condition improved and ultimately PT/OT/ST recommended outpatient treatment 3x weekly.   PRECAUTIONS: None  WEIGHT BEARING RESTRICTIONS: No  PAIN:  Are you having pain? no  FALLS: Has patient fallen in last 6 months? No  LIVING ENVIRONMENT: Lives with: lives with their spouse Lives in: 1 level home Stairs: Yes: External: 4 steps; on right going up Has following equipment at home:  was using a 4 pronged cane but no longer having to use it, built in shower seat   PLOF: Independent, full time landscaping  PATIENT GOALS: Get my right hand stronger and be able to write my name and eat with my right hand. OBJECTIVE:   HAND DOMINANCE: Right  ADLs: Overall ADLs: extra time; difficulty using the R hand Transfers/ambulation related to ADLs: indep with dressing/bathing Eating: just started eating with the R hand but with difficulty Grooming: pt tries a little with the R, but has to use L mostly UB Dressing: difficulty with clothing fasteners LB Dressing: difficulty with clothing fasteners Toileting: indep Bathing: indep Tub Shower transfers: indep Equipment:  see above  IADLs: Shopping: supv Light housekeeping: with effort Meal Prep: cooking on the [employer] mobility: ambulatory without AD Medication management: supv, occasional reminders Financial management: indep Handwriting: 100% legible and Increased time, pt reports almost to his baseline with handwriting.  MOBILITY STATUS:  ambulatory without AD  POSTURE COMMENTS:  No Significant postural limitations Sitting balance: Moves/returns truncal midpoint >2 inches in all planes  ACTIVITY TOLERANCE: Activity tolerance: WFL for assessment activities  FUNCTIONAL OUTCOME MEASURES: FOTO: 65; predicted 73  UPPER EXTREMITY ROM:  BUEs WFL  UPPER EXTREMITY MMT:     MMT  Right eval Left eval  Shoulder flexion 4- 5  Shoulder abduction 4- 5  Shoulder adduction    Shoulder extension    Shoulder internal rotation    Shoulder external rotation    Middle trapezius    Lower trapezius    Elbow flexion 4+ 5  Elbow extension 4+ 5  Wrist flexion 4+ 5  Wrist extension 4+ 5  Wrist ulnar deviation    Wrist radial deviation    Wrist pronation    Wrist supination    (Blank rows = not tested)  HAND FUNCTION: Grip strength: Right: 37 lbs; Left: 64 lbs, Lateral pinch: Right: 11 lbs, Left: 18 lbs, and 3 point pinch: Right: 13 lbs, Left: 20 lbs  COORDINATION: Finger Nose Finger test: good, slightly slower than L 9 Hole Peg test: Right: 35 sec; Left: 25 sec  SENSATION: WFL Light touch: WFL  EDEMA: none  MUSCLE TONE: RUE: Within functional limits  COGNITION: Overall cognitive status: mood lability; pt intermittently tearful x2 during eval  VISION: Subjective report: Pt reports that he wears hard contacts and only put a contact in the L eye today d/t difficulty.  Pt reports denies any changes in vision since CVA.  VISION ASSESSMENT: Ocular ROM: WFL Tracking/Visual pursuits: Able to track stimulus in all quads without difficulty Saccades: WFL Visual Fields: no apparent deficits  PERCEPTION: WFL  PRAXIS: Impaired: Motor planning  OBSERVATIONS: Pt making good efforts to engage the R hand for all assessment activities.   TODAY'S TREATMENT:                                                                                                                              Therapeutic Exercise: Reviewed self passive stretching to R wrist and digits for ext at each joint, working to decrease stiffness when using R hand for daily tasks.  Good return demo.  Facilitated hand strengthening with use of hand gripper set at 11.2# to remove jumbo pegs from pegboard for x1 trial and then able to progress to 17.9# for 2 more trials using R hand.  Min vc to keep R hand IF  flexed around hand gripper for each gripping rep.  Pt worked on additional R Copywriter, advertising tools with pink theraputty using the knob turn, cap turn, peg turn, L-Bar and key turn attachments to target specific components of movements in the hand.  Pt required initial demo of hand grasp patterns with each tool.  PATIENT EDUCATION: Education details: Self passive stretching for R wrist and digit extension to reduce flexor tightness. Person educated: Patient Education method: Explanation and Verbal cues, demo Education  comprehension: able to return demo  HOME EXERCISE PROGRAM: Green medium resistance theraputty, red theraband exercises   GOALS: Goals reviewed with patient? Yes  SHORT TERM GOALS: Target date: 11/12/22 (6 weeks)  Pt will be indep to perform HEP for improving strength and coordination throughout the RUE. Baseline: Eval: initiate putty exercises at eval, further training needed Goal status: INITIAL  LONG TERM GOALS: Target date: 12/24/22 (12 weeks)  Pt will increase FOTO score to 73 or better to indicate improvement in self perceived functional use of the R arm with daily tasks. Baseline: Eval: 65 Goal status: INITIAL  2.  Pt will increase R grip strength by 20 or more lbs in order to improve ability to hold and carry ADL supplies.   Baseline: Eval: R 37 (L 64) Goal status: INITIAL  3.  Pt will improve FMC/dexterity skills in R  hand to be able to pick up coins or pills from a table with greater ease.  Baseline: Eval: with difficulty/increased time (9 hole peg test R 35 sec, L 25 sec) Goal status: INITIAL  4.  Pt will use the R hand to eat with good accuracy and adapted utensils as needed. Baseline: Eval: pt just started eating with the R hand, but with effort and some mess Goal status: INITIAL  5.  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  Baseline: R shoulder 4-,  elbow and wrist 4+ (See LTG #2 for R hand strength); LUE 5/5 throughout  Goal status: INITIAL  ASSESSMENT: CLINICAL IMPRESSION: Able to progress with hand gripper today from 11.2# to 17.9# today to remove jumbo pegs from pegboard for 2 of 3 trials.  Pt continues to progress with use of his R hand with daily tasks.  Pt reported that he was able to use his R hand to pull the cord to start his lawn mower yesterday and the pressure washer today.  Pt stated it takes a couple of tries but it's getting easier.  With gripping activities, pt continues to require intermittent min vc to fully flex the IF around an object, specifically DIP joint.  Pt reports the R IF feels tight and occasionally pt reports discomfort in R forearm flexors when gripping with more force when flexing the IF.  Reviewed self passive stretching to R wrist and digits for ext at each joint, working to decrease stiffness when using R hand for daily tasks.  Good return demo.  With putty tools, pt required intermittent re-gripping of tools, specifically having difficulty maintaining R IF around the various tools.  Pt will continue to benefit from skilled OT to address RUE weakness and coordination deficits in order to maximize indep and use of the R dominant arm for daily tasks, improve function for return to work, and improve QOL.  PERFORMANCE DEFICITS: in functional skills including ADLs, IADLs, coordination, dexterity, strength, Fine motor control, Gross motor control, decreased knowledge of use of DME, and UE functional use, cognitive skills including emotional, and psychosocial skills including coping strategies and routines and behaviors.   IMPAIRMENTS: are limiting patient from ADLs, IADLs, work, and leisure.   CO-MORBIDITIES: has no other co-morbidities that affects occupational performance. Patient will benefit from skilled OT to address above impairments and improve overall function.  MODIFICATION OR ASSISTANCE TO COMPLETE  EVALUATION: No modification of tasks or assist necessary to complete an evaluation.  OT OCCUPATIONAL PROFILE AND HISTORY: Problem focused assessment: Including review of records relating  to presenting problem.  CLINICAL DECISION MAKING: Moderate - several treatment options, min-mod task modification necessary  REHAB POTENTIAL: Good  EVALUATION COMPLEXITY: Moderate    PLAN:  OT FREQUENCY: 2x/week  OT DURATION: 12 weeks  PLANNED INTERVENTIONS: self care/ADL training, therapeutic exercise, therapeutic activity, neuromuscular re-education, manual therapy, passive range of motion, moist heat, cryotherapy, contrast bath, patient/family education, coping strategies training, and DME and/or AE instructions  RECOMMENDED OTHER SERVICES: None at this time  CONSULTED AND AGREED WITH PLAN OF CARE: Patient  PLAN FOR NEXT SESSION: HEP review, neuro re-ed and therapeutic exercises  Danelle Earthly, MS, OTR/L  Otis Dials, OT 11/11/2022, 2:07 PM

## 2022-11-12 ENCOUNTER — Ambulatory Visit: Payer: 59

## 2022-11-16 ENCOUNTER — Ambulatory Visit: Payer: 59 | Admitting: Occupational Therapy

## 2022-11-16 DIAGNOSIS — R2681 Unsteadiness on feet: Secondary | ICD-10-CM | POA: Diagnosis not present

## 2022-11-16 DIAGNOSIS — M6281 Muscle weakness (generalized): Secondary | ICD-10-CM | POA: Diagnosis not present

## 2022-11-16 DIAGNOSIS — R269 Unspecified abnormalities of gait and mobility: Secondary | ICD-10-CM | POA: Diagnosis not present

## 2022-11-16 DIAGNOSIS — R278 Other lack of coordination: Secondary | ICD-10-CM | POA: Diagnosis not present

## 2022-11-16 NOTE — Therapy (Addendum)
OUTPATIENT OCCUPATIONAL THERAPY NEURO TREATMENT NOTE  Patient Name: Eric Austin MRN: [id number] DOB:Nov 25, 1960, 62 y.o., male Today's Date: 11/16/2022  PCP: Dr. Einar Crow REFERRING PROVIDER: Dr. Almon Hercules  END OF SESSION:  OT End of Session - 11/16/22 1149     Visit Number 9    Number of Visits 24    Date for OT Re-Evaluation 12/24/22    OT Start Time 1145    OT Stop Time 1230    OT Time Calculation (min) 45 min    Activity Tolerance Patient tolerated treatment well    Behavior During Therapy [facility] for tasks assessed/performed            No past medical history on file. Past Surgical History:  Procedure Laterality Date   COLONOSCOPY WITH PROPOFOL N/A 07/25/2020   Procedure: COLONOSCOPY WITH PROPOFOL;  Surgeon: Wyline Mood, MD;  Location: [facility] ENDOSCOPY;  Service: Gastroenterology;  Laterality: N/A;   HERNIA REPAIR     kidney removed     Patient Active Problem List   Diagnosis Date Noted   Primary hypertension 11/25/2021   Right foot pain 11/25/2021   Pulmonary nodule 09/29/2021   History of diverticulitis of colon 06/11/2020   Annual physical exam 05/15/2020   BPH without obstruction/lower urinary tract symptoms 05/15/2020   Primary osteoarthritis of right knee 05/15/2020   Irritable bowel syndrome with diarrhea 05/15/2020    ONSET DATE: Feb. 2024  REFERRING DIAG: I63.9 (ICD-10-CM) - Cerebral infarction, unspecified   THERAPY DIAG:  Muscle weakness (generalized)  Rationale for Evaluation and Treatment: Rehabilitation  SUBJECTIVE:  SUBJECTIVE STATEMENT: Pt. Reports doing well today.  Pt accompanied by: significant other  PERTINENT HISTORY:  Per [facility] chart, [name] is a 62 y.o. male with a past medical history of HTN who was transferred to [facility] on 09/03/2022 with acute stroke affecting left pyramidal tract. Initially, PT/OT recommended discharge to [facility]. However, his condition improved and ultimately PT/OT/ST recommended outpatient  treatment 3x weekly.   PRECAUTIONS: None  WEIGHT BEARING RESTRICTIONS: No  PAIN:  Are you having pain? no  FALLS: Has patient fallen in last 6 months? No  LIVING ENVIRONMENT: Lives with: lives with their spouse Lives in: 1 level home Stairs: Yes: External: 4 steps; on right going up Has following equipment at home:  was using a 4 pronged cane but no longer having to use it, built in shower seat   PLOF: Independent, full time landscaping  PATIENT GOALS: Get my right hand stronger and be able to write my name and eat with my right hand. OBJECTIVE:   HAND DOMINANCE: Right  ADLs: Overall ADLs: extra time; difficulty using the R hand Transfers/ambulation related to ADLs: indep with dressing/bathing Eating: just started eating with the R hand but with difficulty Grooming: pt tries a little with the R, but has to use L mostly UB Dressing: difficulty with clothing fasteners LB Dressing: difficulty with clothing fasteners Toileting: indep Bathing: indep Tub Shower transfers: indep Equipment:  see above  IADLs: Shopping: supv Light housekeeping: with effort Meal Prep: cooking on the [employer] mobility: ambulatory without AD Medication management: supv, occasional reminders Financial management: indep Handwriting: 100% legible and Increased time, pt reports almost to his baseline with handwriting.   MOBILITY STATUS:  ambulatory without AD  POSTURE COMMENTS:  No Significant postural limitations Sitting balance: Moves/returns truncal midpoint >2 inches in all planes  ACTIVITY TOLERANCE: Activity tolerance: WFL for assessment activities  FUNCTIONAL OUTCOME MEASURES: FOTO: 65; predicted 73  UPPER EXTREMITY  ROM:  BUEs WFL  UPPER EXTREMITY MMT:     MMT Right eval Left eval  Shoulder flexion 4- 5  Shoulder abduction 4- 5  Shoulder adduction    Shoulder extension    Shoulder internal rotation    Shoulder external rotation    Middle trapezius    Lower  trapezius    Elbow flexion 4+ 5  Elbow extension 4+ 5  Wrist flexion 4+ 5  Wrist extension 4+ 5  Wrist ulnar deviation    Wrist radial deviation    Wrist pronation    Wrist supination    (Blank rows = not tested)  HAND FUNCTION: Grip strength: Right: 37 lbs; Left: 64 lbs, Lateral pinch: Right: 11 lbs, Left: 18 lbs, and 3 point pinch: Right: 13 lbs, Left: 20 lbs  COORDINATION: Finger Nose Finger test: good, slightly slower than L 9 Hole Peg test: Right: 35 sec; Left: 25 sec  SENSATION: WFL Light touch: WFL  EDEMA: none  MUSCLE TONE: RUE: Within functional limits  COGNITION: Overall cognitive status: mood lability; pt intermittently tearful x2 during eval  VISION: Subjective report: Pt reports that he wears hard contacts and only put a contact in the L eye today d/t difficulty.  Pt reports denies any changes in vision since CVA.  VISION ASSESSMENT: Ocular ROM: WFL Tracking/Visual pursuits: Able to track stimulus in all quads without difficulty Saccades: WFL Visual Fields: no apparent deficits  PERCEPTION: WFL  PRAXIS: Impaired: Motor planning  OBSERVATIONS: Pt making good efforts to engage the R hand for all assessment activities.   TODAY'S TREATMENT:                                                                                                                                Neuromuscular re-education   Pt. worked on  right  hand  translatory movements with emphasis placed on moving yellow, red, green, blue, and black resistive clips through his hand between lateral, and 3pt. Pinch.  Pt. worked on 2pt. Pinch/grasp on the resistive clips. Pt. worked on grasping, and manipulating 1", 3/4", and 1/2" washers from a magnetic dish using a point grasp pattern. Pt. worked on reaching up, stabilizing, and sustaining shoulder elevation while placing the washer over a small precise target on vertical dowels positioned at various angles.  Pt. worked on grasping washers, and  controlling movements needed to remove them from the vertical, and diagonal dowels. Pt. performed [facility] tasks using the Grooved pegboard. Pt. worked on grasping the grooved pegs from a horizontal position, and moving the pegs to a vertical position in the hand to prepare for placing them in the grooved slot.   Pt. worked on removing the grooved pegs while alternating thumb opposition to the tip of the 2nd through 5th digits. Pt. worked on grasping extra small 1" pegs.     Therapeutic Exercise:  Pt. performed right gross gripping with a gross grip strengthener. Pt. worked on sustaining grip  while grasping pegs and reaching at various heights. The Gripper was set to 17.9 # of grip strength resistance.      PATIENT EDUCATION: Education details: Self passive stretching for R wrist and digit extension to reduce flexor tightness. Person educated: Patient Education method: Explanation and Verbal cues, demo Education comprehension: able to return demo  HOME EXERCISE PROGRAM: Green medium resistance theraputty, red theraband exercises   GOALS: Goals reviewed with patient? Yes  SHORT TERM GOALS: Target date: 11/12/22 (6 weeks)  Pt will be indep to perform HEP for improving strength and coordination throughout the RUE. Baseline: Eval: initiate putty exercises at eval, further training needed Goal status: INITIAL  LONG TERM GOALS: Target date: 12/24/22 (12 weeks)  Pt will increase FOTO score to 73 or better to indicate improvement in self perceived functional use of the R arm with daily tasks. Baseline: Eval: 65 Goal status: INITIAL  2.  Pt will increase R grip strength by 20 or more lbs in order to improve ability to hold and carry ADL supplies.   Baseline: Eval: R 37 (L 64) Goal status: INITIAL  3.  Pt will improve FMC/dexterity skills in R  hand to be able to pick up coins or pills from a table with greater ease.  Baseline: Eval: with difficulty/increased time (9 hole peg test R 35 sec, L 25  sec) Goal status: INITIAL  4.  Pt will use the R hand to eat with good accuracy and adapted utensils as needed. Baseline: Eval: pt just started eating with the R hand, but with effort and some mess Goal status: INITIAL  5.  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  Baseline: R shoulder 4-, elbow and wrist 4+ (See LTG #2 for R hand strength); LUE 5/5 throughout  Goal status: INITIAL  ASSESSMENT: CLINICAL IMPRESSION:  Pt. is making progress overall, and reports that he can now use his right hand to turn his car key. Pt. was able to complete 3pt., and lateral pinch strength, however, required increased time to perform translatory movements of the right hand, as wel as moving the washers through his hand from the palm to the tip of the 2nd digit, and thumb. Pt. was able to manipulate the 1" grooved pegs however dropped several out of his right hand when performing translatory movements, and move them through his hand. Pt will continue to benefit from skilled OT to address RUE weakness and coordination deficits in order to maximize independent and use of the R dominant arm for daily tasks, improve function for return to work, and improve QOL.  PERFORMANCE DEFICITS: in functional skills including ADLs, IADLs, coordination, dexterity, strength, Fine motor control, Gross motor control, decreased knowledge of use of DME, and UE functional use, cognitive skills including emotional, and psychosocial skills including coping strategies and routines and behaviors.   IMPAIRMENTS: are limiting patient from ADLs, IADLs, work, and leisure.   CO-MORBIDITIES: has no other co-morbidities that affects occupational performance. Patient will benefit from skilled OT to address above impairments and improve overall function.  MODIFICATION OR ASSISTANCE TO COMPLETE EVALUATION: No modification of tasks or assist necessary to complete an  evaluation.  OT OCCUPATIONAL PROFILE AND HISTORY: Problem focused assessment: Including review of records relating to presenting problem.  CLINICAL DECISION MAKING: Moderate - several treatment options, min-mod task modification necessary  REHAB POTENTIAL: Good  EVALUATION COMPLEXITY: Moderate    PLAN:  OT FREQUENCY: 2x/week  OT DURATION: 12 weeks  PLANNED INTERVENTIONS: self care/ADL training, therapeutic exercise, therapeutic activity, neuromuscular re-education, manual therapy, passive range of motion, moist heat, cryotherapy, contrast bath, patient/family education, coping strategies training, and DME and/or AE instructions  RECOMMENDED OTHER SERVICES: None at this time  CONSULTED AND AGREED WITH PLAN OF CARE: Patient  PLAN FOR NEXT SESSION: HEP review, neuro re-ed and therapeutic exercises  Olegario Messier, MS, OTR/L   11/16/2022

## 2022-11-18 ENCOUNTER — Ambulatory Visit: Payer: 59 | Admitting: Occupational Therapy

## 2022-11-18 ENCOUNTER — Ambulatory Visit: Payer: 59

## 2022-11-23 ENCOUNTER — Ambulatory Visit: Payer: 59

## 2022-11-26 ENCOUNTER — Telehealth: Payer: Self-pay

## 2022-11-26 ENCOUNTER — Ambulatory Visit: Payer: 59

## 2022-11-26 NOTE — Telephone Encounter (Signed)
LVM for patient to call if he cant make PT and OT for Monday. I changed the treatment appointment Monday to evaluation and let him know if he cant keep this appintmet to call. Left number as well.

## 2022-11-30 ENCOUNTER — Ambulatory Visit: Payer: 59

## 2022-11-30 ENCOUNTER — Ambulatory Visit: Payer: 59 | Admitting: Occupational Therapy

## 2022-11-30 DIAGNOSIS — R269 Unspecified abnormalities of gait and mobility: Secondary | ICD-10-CM | POA: Diagnosis not present

## 2022-11-30 DIAGNOSIS — R2681 Unsteadiness on feet: Secondary | ICD-10-CM | POA: Diagnosis not present

## 2022-11-30 DIAGNOSIS — M6281 Muscle weakness (generalized): Secondary | ICD-10-CM

## 2022-11-30 DIAGNOSIS — R278 Other lack of coordination: Secondary | ICD-10-CM | POA: Diagnosis not present

## 2022-11-30 NOTE — Therapy (Signed)
Occupational Therapy Progress Note  Dates of reporting period  09/30/2022   to   11/30/2022   Patient Name: Eric Austin MRN: [id number] DOB:10-01-1960, 62 y.o., male Today's Date: 11/30/2022  PCP: Dr. Einar Crow REFERRING PROVIDER: Dr. Almon Hercules  END OF SESSION:  OT End of Session - 11/30/22 1019     Visit Number 10    Number of Visits 24    Date for OT Re-Evaluation 12/24/22    OT Start Time 1145    OT Stop Time 1230    OT Time Calculation (min) 45 min    Activity Tolerance Patient tolerated treatment well    Behavior During Therapy [facility] for tasks assessed/performed            No past medical history on file. Past Surgical History:  Procedure Laterality Date   COLONOSCOPY WITH PROPOFOL N/A 07/25/2020   Procedure: COLONOSCOPY WITH PROPOFOL;  Surgeon: Wyline Mood, MD;  Location: [facility] ENDOSCOPY;  Service: Gastroenterology;  Laterality: N/A;   HERNIA REPAIR     kidney removed     Patient Active Problem List   Diagnosis Date Noted   Primary hypertension 11/25/2021   Right foot pain 11/25/2021   Pulmonary nodule 09/29/2021   History of diverticulitis of colon 06/11/2020   Annual physical exam 05/15/2020   BPH without obstruction/lower urinary tract symptoms 05/15/2020   Primary osteoarthritis of right knee 05/15/2020   Irritable bowel syndrome with diarrhea 05/15/2020    ONSET DATE: Feb. 2024  REFERRING DIAG: I63.9 (ICD-10-CM) - Cerebral infarction, unspecified   THERAPY DIAG:  Muscle weakness (generalized)  Other lack of coordination  Rationale for Evaluation and Treatment: Rehabilitation  SUBJECTIVE:  SUBJECTIVE STATEMENT:  Pt. Reports needing to cancel on [place].  Pt accompanied by: significant other  PERTINENT HISTORY:  Per [facility] chart, Eric Austin is a 62 y.o. male with a past medical history of HTN who was transferred to [facility] on 09/03/2022 with acute stroke affecting left pyramidal tract. Initially,  PT/OT recommended discharge to [facility]. However, his condition improved and ultimately PT/OT/ST recommended outpatient treatment 3x weekly.   PRECAUTIONS: None  WEIGHT BEARING RESTRICTIONS: No  PAIN:  Are you having pain? no  FALLS: Has patient fallen in last 6 months? No  LIVING ENVIRONMENT: Lives with: lives with their spouse Lives in: 1 level home Stairs: Yes: External: 4 steps; on right going up Has following equipment at home:  was using a 4 pronged cane but no longer having to use it, built in shower seat   PLOF: Independent, full time landscaping  PATIENT GOALS: Get my right hand stronger and be able to write my name and eat with my right hand. OBJECTIVE:   HAND DOMINANCE: Right  ADLs: Overall ADLs: extra time; difficulty using the R hand Transfers/ambulation related to ADLs: indep with dressing/bathing Eating: just started eating with the R hand but with difficulty Grooming: pt tries a little with the R, but has to use L mostly UB Dressing: difficulty with clothing fasteners LB Dressing: difficulty with clothing fasteners Toileting: indep Bathing: indep Tub Shower transfers: indep Equipment:  see above  IADLs: Shopping: supv Light housekeeping: with effort Meal Prep: cooking on the [employer] mobility: ambulatory without AD Medication management: supv, occasional reminders Financial management: indep Handwriting: 100% legible and Increased time, pt reports almost to his baseline with handwriting.   MOBILITY STATUS:  ambulatory without AD  POSTURE COMMENTS:  No Significant postural limitations Sitting balance: Moves/returns truncal  midpoint >2 inches in all planes  ACTIVITY TOLERANCE: Activity tolerance: WFL for assessment activities  FUNCTIONAL OUTCOME MEASURES: FOTO: 65; predicted 73  UPPER EXTREMITY ROM:  BUEs [facility]  11/30/2022:   Digit flexion to the [facility]:  2nd:4 cm (0) 3rd: 3.5 cm (0) 4th: 3cm (0) 5th: 2.5cm (0)  UPPER EXTREMITY MMT:     MMT  Right eval Right  11/30/2022 Left eval  Shoulder flexion 4- 4+ 5  Shoulder abduction 4- 4 5  Shoulder adduction     Shoulder extension     Shoulder internal rotation     Shoulder external rotation     Middle trapezius     Lower trapezius     Elbow flexion 4+ 5 5  Elbow extension 4+ 5 5  Wrist flexion 4+ 5 5  Wrist extension 4+ 5 5  Wrist ulnar deviation     Wrist radial deviation     Wrist pronation  5   Wrist supination  5   (Blank rows = not tested)  HAND FUNCTION: Grip strength: Right: 37 lbs; Left: 64 lbs, Lateral pinch: Right: 11 lbs, Left: 18 lbs, and 3 point pinch: Right: 13 lbs, Left: 20 lbs  11/30/2022  Grip strength: Right: 25 lbs; Left: 64 lbs, Lateral pinch: Right: 14 lbs, Left: 18 lbs, and 3 point pinch: Right: 11 lbs, Left: 20 lbs   COORDINATION: Finger Nose Finger test: good, slightly slower than L 9 Hole Peg test: Right: 35 sec; Left: 25 sec  11/30/2022  9 Hole Peg test: Right: 33 sec; Left: 25 sec  SENSATION: WFL Light touch: WFL  EDEMA: none  MUSCLE TONE: RUE: Within functional limits  COGNITION: Overall cognitive status: mood lability; pt intermittently tearful x2 during eval  VISION: Subjective report: Pt reports that he wears hard contacts and only put a contact in the L eye today d/t difficulty.  Pt reports denies any changes in vision since CVA.  VISION ASSESSMENT: Ocular ROM: WFL Tracking/Visual pursuits: Able to track stimulus in all quads without difficulty Saccades: WFL Visual Fields: no apparent deficits  PERCEPTION: WFL  PRAXIS: Impaired: Motor planning  OBSERVATIONS: Pt making good efforts to engage the R hand for all assessment activities.   TODAY'S TREATMENT:                                                                                                                                Therapeutic Exercise:  Pt. Education was provided about intrinsic stretches, and tendon glide exercises to promote digit  flexion.     PATIENT EDUCATION: Education details: Self passive stretching for R wrist and digit extension to reduce flexor tightness. Person educated: Patient Education method: Explanation and Verbal cues, demo Education comprehension: able to return demo  HOME EXERCISE PROGRAM:   Left hand fisting, tendon glide, and intrinsic stretches.  GOALS: Goals reviewed with patient? Yes  SHORT TERM GOALS: Target date: 01/11/2023    Pt will be indep  to perform HEP for improving strength and coordination throughout the RUE. Baseline: 11/30/2022: Independent. Eval: initiate putty exercises at eval, further training needed Goal status: Ongoing  LONG TERM GOALS: Target date: 12/24/22 (12 weeks)  Pt will increase FOTO score to 73 or better to indicate improvement in self perceived functional use of the R arm with daily tasks. Baseline: 11/30/2022:73 TR score: 73 Eval: 65 Goal status: Achieved  2.  Pt will increase R grip strength by 20 or more lbs in order to improve ability to hold and carry ADL supplies.   Baseline: 11/30/2022: R: 25# Eval: R 37 (L 64) Goal status: Ongoing  3.  Pt will improve FMC/dexterity skills in R  hand to be able to pick up coins or pills from a table with greater ease.  Baseline: 11/30/2022: R: 33 sec. Eval: with difficulty/increased time (9 hole peg test R 35 sec, L 25 sec) Goal status: INITIAL  4.  Pt will use the R hand to eat with good accuracy and adapted utensils as needed. Baseline: 11/30/2022: Pt. Is progressing with self feeding, handling utensils. Eval: pt just started eating with the R hand, but with effort and some mess Goal status: INITIAL  5.  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  Baseline: 11/30/2022: Right shoulder flexion 4+/, abduction 4/5, elbow flexion: 5/5 extension, forearm supination 5/5, pronation 5/5, wrist extension:5/5 R shoulder 4-, elbow and wrist 4+ (See  LTG #2 for R hand strength); LUE 5/5 throughout  Goal status: INITIAL  ASSESSMENT: CLINICAL IMPRESSION:  Pt. has made progress overall with right hand function. Pt. Is able to use his right hand to engage landscaping work, opening lime packages, lifting ladders, using a weed eater, and handling utensils for self- feeding. Pt. Continues to present with limited right hand grip strength, and 3pt. Pinch strength. Pt. Has difficulty formulating a right hand fist, and distal motor control while his arms are sustained in elevation when washing his hair. FOTO score is 73. Pt will continue to benefit from skilled OT to address RUE weakness and coordination deficits in order to maximize independent and use of the R dominant arm for daily tasks, improve function for return to work, and improve QOL.  PERFORMANCE DEFICITS: in functional skills including ADLs, IADLs, coordination, dexterity, strength, Fine motor control, Gross motor control, decreased knowledge of use of DME, and UE functional use, cognitive skills including emotional, and psychosocial skills including coping strategies and routines and behaviors.   IMPAIRMENTS: are limiting patient from ADLs, IADLs, work, and leisure.   CO-MORBIDITIES: has no other co-morbidities that affects occupational performance. Patient will benefit from skilled OT to address above impairments and improve overall function.  MODIFICATION OR ASSISTANCE TO COMPLETE EVALUATION: No modification of tasks or assist necessary to complete an evaluation.  OT OCCUPATIONAL PROFILE AND HISTORY: Problem focused assessment: Including review of records relating to presenting problem.  CLINICAL DECISION MAKING: Moderate - several treatment options, min-mod task modification necessary  REHAB POTENTIAL: Good  EVALUATION COMPLEXITY: Moderate    PLAN:  OT FREQUENCY: 2x/week  OT DURATION: 12 weeks  PLANNED INTERVENTIONS: self care/ADL training, therapeutic exercise, therapeutic  activity, neuromuscular re-education, manual therapy, passive range of motion, moist heat, cryotherapy, contrast bath, patient/family education, coping strategies training, and DME and/or AE instructions  RECOMMENDED OTHER SERVICES: None at this time  CONSULTED AND AGREED WITH PLAN OF CARE: Patient  PLAN FOR NEXT SESSION: HEP review, neuro re-ed  and therapeutic exercises  Olegario Messier, MS, OTR/L   11/30/2022

## 2022-11-30 NOTE — Therapy (Signed)
OUTPATIENT PHYSICAL THERAPY NEURO EVALUATION   Patient Name: Eric Austin MRN: [id number] DOB:[birth year], 62 y.o., male Today's Date: 11/30/2022   PCP: Dr. Einar Crow REFERRING PROVIDER: Dr. Marygrace Drought  END OF SESSION:  PT End of Session - 11/30/22 0948     Visit Number 1    Number of Visits 24    Date for PT Re-Evaluation 02/22/23    Progress Note Due on Visit 10    PT Start Time 0942    PT Stop Time 1015    PT Time Calculation (min) 33 min    Equipment Utilized During Treatment Gait belt    Activity Tolerance Patient tolerated treatment well    Behavior During Therapy [facility] for tasks assessed/performed             History reviewed. No pertinent past medical history. Past Surgical History:  Procedure Laterality Date   COLONOSCOPY WITH PROPOFOL N/A 07/25/2020   Procedure: COLONOSCOPY WITH PROPOFOL;  Surgeon: Wyline Mood, MD;  Location: [facility] ENDOSCOPY;  Service: Gastroenterology;  Laterality: N/A;   HERNIA REPAIR     kidney removed     Patient Active Problem List   Diagnosis Date Noted   Primary hypertension 11/25/2021   Right foot pain 11/25/2021   Pulmonary nodule 09/29/2021   History of diverticulitis of colon 06/11/2020   Annual physical exam 05/15/2020   BPH without obstruction/lower urinary tract symptoms 05/15/2020   Primary osteoarthritis of right knee 05/15/2020   Irritable bowel syndrome with diarrhea 05/15/2020    ONSET DATE: 09/03/2022  REFERRING DIAG: I63.9 (ICD-10-CM) - Cerebral infarction, unspecified   THERAPY DIAG:  Unsteadiness on feet  Rationale for Evaluation and Treatment: Rehabilitation  SUBJECTIVE:                                                                                                                                                                                             SUBJECTIVE STATEMENT: Patient reports having a stroke back in Feb 2024 and sustaining weakness on right side - "My arm was affected more  than my leg but I feel like my right leg becomes weaker as the day goes."  Pt accompanied by: self  PERTINENT HISTORY: PER [facility] hospital [facility] Course from 09/03/2022:  Eric Austin is a 62 y.o. male with a past medical history of HTN who was transferred to [facility] on 09/03/2022 with acute stroke affecting left pyramidal tract. Initially, PT/OT recommended discharge to [facility]. However, his condition improved and ultimately PT/OT/ST recommended outpatient treatment 3x weekly.   Per Dr. Judie Petit. [place] office visit note on 09/14/2022: HISTORY OF PRESENT ILLNESS  History of  arterial ischemic stroke Post recent left pontine stroke presenting after tpa window and untimately given asa/plavix and bp/cholesterol control. Some right hemiparesis is left, partial and using a cane but improving some  Right hemiparesis (CMS-HCC) Partial hemipareisis post left pontine stroke  Restless legs syndrome Gabapentin helps   PAIN:  Are you having pain? No  PRECAUTIONS: None  WEIGHT BEARING RESTRICTIONS: No  FALLS: Has patient fallen in last 6 months? 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   PATIENT GOALS:  Just to get a little stronger so I don't wear out so quick.  OBJECTIVE:   DIAGNOSTIC FINDINGS: Transthoracic echocardiogram, 09/03/22: LV normal size with upper normal wall thickness. LVEF visually estimated at 55-60%. Aortic valve is trileaflet with mildly thickened leaflets with normal excursion. RV normal in size with normal function.  CTA Head and Neck, 09/03/22: No findings to suggest large vessel occlusion, as clinically questioned. No evidence of dissection, aneurysm or high-grade stenosis of the carotid or vertebral arteries.Small air  locules in the right cavernous sinus and cervical epidural space likely representing iatrogenic venous gas embolism from IV access.  MRI Brain with and without contrast, 09/05/22: Acute to early subacute left pontine infarct. Chronic microvascular changes as characterized above.   COGNITION: Overall cognitive status: Within functional limits for tasks assessed   SENSATION: WFL  COORDINATION: Slight delay in heel to shin Right vs. Left  EDEMA:  None reported or observed in all extremities  MUSCLE TONE: RLE: Within functional limits RUE tone observed- minimal arm sway with gait   POSTURE: rounded shoulders and forward head  LOWER EXTREMITY ROM:      All Left and Right AROM = WNL with hips/knees/ankles   LOWER EXTREMITY MMT:    MMT Right Eval Left Eval  Hip flexion 4 5  Hip extension 4 5  Hip abduction 4 5  Hip adduction 5 5  Hip internal rotation 4 5  Hip external rotation 4 5  Knee flexion 4 5  Knee extension 4 5  Ankle dorsiflexion 4 5  Ankle plantarflexion    Ankle inversion    Ankle eversion    (Blank rows = not tested)  BED MOBILITY:  Not test- Patient reports independent  TRANSFERS: Assistive device utilized:  None   Sit to stand: Complete Independence Stand to sit: Complete Independence Chair to chair: Complete Independence Floor:  Not tested    STAIRS: Level of Assistance: SBA Stair Negotiation Technique: Alternating Pattern  with Bilateral Rails Number of Stairs: 4  Height of Stairs: 6"  Comments: No observed LOB- step through yet use of railing.  GAIT: Gait pattern: decreased arm swing- Right and decreased step length- Left Distance walked: 200+ feet Assistive device utilized:  None Level of assistance: CGA Comments:  No LOB or ataxia with normal walking- slow cadence/gait speed  FUNCTIONAL TESTS:  5 times sit to stand: 18.81 sec with  Timed up and go (TUG): 12.67 sec without an AD 10 meter walk test: 0.68 m/s Functional gait  assessment: 24/30  PATIENT SURVEYS:  FOTO To be tested next visit  TODAY'S TREATMENT:  DATE: 11/30/2022- PT Evaluation     PATIENT EDUCATION: Education details: Purpose of PT for LE strength and balance; Plan of care discussed- set goals with patient.  Person educated: Patient Education method: Explanation, Demonstration, Tactile cues, and Verbal cues Education comprehension: verbalized understanding, returned demonstration, verbal cues required, tactile cues required, and needs further education  HOME EXERCISE PROGRAM: Access Code: [email or web address] URL: [email or web address] Date: 11/30/2022 Prepared by: Maureen Ralphs  Exercises - Single Leg Stance  - 2-3 x weekly - 5 sets - up to 10+ sec hold - Tandem Stance  - 2-3 x weekly - 3 sets - 20-30 sec hold     GOALS: Goals reviewed with patient? Yes  SHORT TERM GOALS: Target date: 01/11/2023  Pt will be independent with HEP in order to improve strength and balance in order to decrease fall risk and improve function at home and work.   Baseline: EVAL- Patient has no formal HEP in place Goal status: INITIAL    LONG TERM GOALS: Target date: 02/22/2023  Pt will improve FOTO to target score of __ to display perceived improvements in ability to complete ADL's.  Baseline: EVAL - Not assessed due to patient late arrival- will assess next visit Goal status: INITIAL  2.  Pt will increase by at least 0.13 m/s in order to demonstrate clinically significant improvement in community ambulation.   Baseline: EVAL= 0.68 m/s Goal status: INITIAL  3.  Pt will improve FGA by at least 3 points in order to demonstrate clinically significant improvement in balance and decreased risk for falls. Baseline: EVAL= 24/30 Goal status: INITIAL  4.  Pt will decrease 5TSTS by at least 3 seconds in order to  demonstrate clinically significant improvement in LE strength. Baseline: EVAL= 18.81 sec without UE support Goal status: INITIAL   ASSESSMENT:  CLINICAL IMPRESSION: Patient is a 62 y.o. male who was seen today for physical therapy evaluation and treatment for CVA. He presents with increased right LE muscle weakness and impaired balance (as seen by observed gait and FGA test) affecting his mobility. 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    OBJECTIVE IMPAIRMENTS: Abnormal gait, decreased balance, decreased coordination, decreased endurance, decreased mobility, difficulty walking, and decreased strength.   ACTIVITY LIMITATIONS: lifting, bending, standing, squatting, and stairs  PARTICIPATION LIMITATIONS: cleaning, community activity, occupation, and yard work  PERSONAL FACTORS: 1 comorbidity: HTN  are also affecting patient's functional outcome.   REHAB POTENTIAL: Excellent  CLINICAL DECISION MAKING: Stable/uncomplicated  EVALUATION COMPLEXITY: Low  PLAN:  PT FREQUENCY: 1-2x/week  PT DURATION: 12 weeks  PLANNED INTERVENTIONS: Therapeutic exercises, Therapeutic activity, Neuromuscular re-education, Balance training, Gait training, Patient/Family education, Self Care, Joint mobilization, Joint manipulation, Stair training, Vestibular training, Canalith repositioning, DME instructions, Dry Needling, Electrical stimulation, Spinal manipulation, Spinal mobilization, Cryotherapy, Moist heat, Manual therapy, and Re-evaluation  PLAN FOR NEXT SESSION: Issue FOTO next session. Instruct in LE strengthening and higher level balance next session.    Lenda Kelp, PT 11/30/2022, 10:47 AM

## 2022-12-03 ENCOUNTER — Ambulatory Visit: Payer: 59

## 2022-12-09 ENCOUNTER — Ambulatory Visit: Payer: 59

## 2022-12-09 DIAGNOSIS — R269 Unspecified abnormalities of gait and mobility: Secondary | ICD-10-CM | POA: Diagnosis not present

## 2022-12-09 DIAGNOSIS — R278 Other lack of coordination: Secondary | ICD-10-CM | POA: Diagnosis not present

## 2022-12-09 DIAGNOSIS — M6281 Muscle weakness (generalized): Secondary | ICD-10-CM

## 2022-12-09 DIAGNOSIS — R2681 Unsteadiness on feet: Secondary | ICD-10-CM | POA: Diagnosis not present

## 2022-12-09 NOTE — Therapy (Signed)
Occupational Therapy Neuro Treatment Note  Patient Name: Eric Austin MRN: [id number] DOB:[birth year], 61 y.o., male Today's Date: 12/09/2022  PCP: Dr. Einar Crow REFERRING PROVIDER: Dr. Almon Hercules  END OF SESSION:  OT End of Session - 12/09/22 1306     Visit Number 11    Number of Visits 24    Date for OT Re-Evaluation 12/24/22    Progress Note Due on Visit 10    OT Start Time 1145    OT Stop Time 1230    OT Time Calculation (min) 45 min    Equipment Utilized During Treatment none    Activity Tolerance Patient tolerated treatment well    Behavior During Therapy [facility] for tasks assessed/performed            No past medical history on file. Past Surgical History:  Procedure Laterality Date   COLONOSCOPY WITH PROPOFOL N/A 07/25/2020   Procedure: COLONOSCOPY WITH PROPOFOL;  Surgeon: Wyline Mood, MD;  Location: [facility] ENDOSCOPY;  Service: Gastroenterology;  Laterality: N/A;   HERNIA REPAIR     kidney removed     Patient Active Problem List   Diagnosis Date Noted   Primary hypertension 11/25/2021   Right foot pain 11/25/2021   Pulmonary nodule 09/29/2021   History of diverticulitis of colon 06/11/2020   Annual physical exam 05/15/2020   BPH without obstruction/lower urinary tract symptoms 05/15/2020   Primary osteoarthritis of right knee 05/15/2020   Irritable bowel syndrome with diarrhea 05/15/2020    ONSET DATE: Feb. 2024  REFERRING DIAG: I63.9 (ICD-10-CM) - Cerebral infarction, unspecified   THERAPY DIAG:  Muscle weakness (generalized)  Other lack of coordination  Rationale for Evaluation and Treatment: Rehabilitation  SUBJECTIVE:  SUBJECTIVE STATEMENT: Pt reported having a good time in the [place] with his family.  Pt accompanied by: significant other  PERTINENT HISTORY:  Per [facility] chart, Eric EWALT is a 63 y.o. male with a past medical history of HTN who was transferred to [facility] on 09/03/2022 with acute stroke affecting left pyramidal  tract. Initially, PT/OT recommended discharge to [facility]. However, his condition improved and ultimately PT/OT/ST recommended outpatient treatment 3x weekly.   PRECAUTIONS: None  WEIGHT BEARING RESTRICTIONS: No  PAIN:  Are you having pain? no  FALLS: Has patient fallen in last 6 months? No  LIVING ENVIRONMENT: Lives with: lives with their spouse Lives in: 1 level home Stairs: Yes: External: 4 steps; on right going up Has following equipment at home:  was using a 4 pronged cane but no longer having to use it, built in shower seat   PLOF: Independent, full time landscaping  PATIENT GOALS: Get my right hand stronger and be able to write my name and eat with my right hand. OBJECTIVE:   HAND DOMINANCE: Right  ADLs: Overall ADLs: extra time; difficulty using the R hand Transfers/ambulation related to ADLs: indep with dressing/bathing Eating: just started eating with the R hand but with difficulty Grooming: pt tries a little with the R, but has to use L mostly UB Dressing: difficulty with clothing fasteners LB Dressing: difficulty with clothing fasteners Toileting: indep Bathing: indep Tub Shower transfers: indep Equipment:  see above  IADLs: Shopping: supv Light housekeeping: with effort Meal Prep: cooking on the [employer] mobility: ambulatory without AD Medication management: supv, occasional reminders Financial management: indep Handwriting: 100% legible and Increased time, pt reports almost to his baseline with handwriting.   MOBILITY STATUS:  ambulatory without AD  POSTURE COMMENTS:  No Significant postural limitations Sitting balance:  Moves/returns truncal midpoint >2 inches in all planes  ACTIVITY TOLERANCE: Activity tolerance: WFL for assessment activities  FUNCTIONAL OUTCOME MEASURES: FOTO: 65; predicted 73  UPPER EXTREMITY ROM:  BUEs [facility]  11/30/2022:   Digit flexion to the [facility]:  2nd:4 cm (0) 3rd: 3.5 cm (0) 4th: 3cm (0) 5th: 2.5cm (0)  UPPER  EXTREMITY MMT:     MMT Right eval Right  11/30/2022 Left eval  Shoulder flexion 4- 4+ 5  Shoulder abduction 4- 4 5  Shoulder adduction     Shoulder extension     Shoulder internal rotation     Shoulder external rotation     Middle trapezius     Lower trapezius     Elbow flexion 4+ 5 5  Elbow extension 4+ 5 5  Wrist flexion 4+ 5 5  Wrist extension 4+ 5 5  Wrist ulnar deviation     Wrist radial deviation     Wrist pronation  5   Wrist supination  5   (Blank rows = not tested)  HAND FUNCTION: Grip strength: Right: 37 lbs; Left: 64 lbs, Lateral pinch: Right: 11 lbs, Left: 18 lbs, and 3 point pinch: Right: 13 lbs, Left: 20 lbs  11/30/2022  Grip strength: Right: 25 lbs; Left: 64 lbs, Lateral pinch: Right: 14 lbs, Left: 18 lbs, and 3 point pinch: Right: 11 lbs, Left: 20 lbs   COORDINATION: Finger Nose Finger test: good, slightly slower than L 9 Hole Peg test: Right: 35 sec; Left: 25 sec  11/30/2022  9 Hole Peg test: Right: 33 sec; Left: 25 sec  SENSATION: WFL Light touch: WFL  EDEMA: none  MUSCLE TONE: RUE: Within functional limits  COGNITION: Overall cognitive status: mood lability; pt intermittently tearful x2 during eval  VISION: Subjective report: Pt reports that he wears hard contacts and only put a contact in the L eye today d/t difficulty.  Pt reports denies any changes in vision since CVA.  VISION ASSESSMENT: Ocular ROM: WFL Tracking/Visual pursuits: Able to track stimulus in all quads without difficulty Saccades: WFL Visual Fields: no apparent deficits  PERCEPTION: WFL  PRAXIS: Impaired: Motor planning  OBSERVATIONS: Pt making good efforts to engage the R hand for all assessment activities.   TODAY'S TREATMENT:  Neuro re-ed: Facilitated R hand FMC/dexterity skills. Pt practiced reps of picking up small and medium sized beads from table top with and without a non-skid surface, storing in hand, and discarding 1 at a time into pill bottle by moving  beads from palm to fingertips.  Pt practiced scooping a handful of beads into palm, storing, and dumping palm full from ulnar side of hand into pill bottle.  Pt practiced reps of picking up small beads with tweezers and discarding them into pill bottle.                                                                                                                             Therapeutic Exercise: Reviewed intrinsic  stretches and performed passive MP, PIP, and DIP flex/ext stretches for R hand digits, working to decrease stiffness in R hand and promote full composite fist.   PATIENT EDUCATION: Education details: Self passive stretching for R wrist and digit extension to reduce flexor tightness, self passive digit flexion for increasing composite fist. Person educated: Patient Education method: Explanation and Verbal cues, demo Education comprehension: able to return demo  HOME EXERCISE PROGRAM:   Left hand fisting, tendon glide, and intrinsic stretches.  GOALS: Goals reviewed with patient? Yes  SHORT TERM GOALS: Target date: 01/11/2023    Pt will be indep to perform HEP for improving strength and coordination throughout the RUE. Baseline: 11/30/2022: Independent. Eval: initiate putty exercises at eval, further training needed Goal status: Ongoing  LONG TERM GOALS: Target date: 12/24/22 (12 weeks)  Pt will increase FOTO score to 73 or better to indicate improvement in self perceived functional use of the R arm with daily tasks. Baseline: 11/30/2022:73 TR score: 73 Eval: 65 Goal status: Achieved  2.  Pt will increase R grip strength by 20 or more lbs in order to improve ability to hold and carry ADL supplies.   Baseline: 11/30/2022: R: 25# Eval: R 37 (L 64) Goal status: Ongoing  3.  Pt will improve FMC/dexterity skills in R  hand to be able to pick up coins or pills from a table with greater ease.  Baseline: 11/30/2022: R: 33 sec. Eval: with difficulty/increased time (9 hole peg test R 35  sec, L 25 sec) Goal status: INITIAL  4.  Pt will use the R hand to eat with good accuracy and adapted utensils as needed. Baseline: 11/30/2022: Pt. Is progressing with self feeding, handling utensils. Eval: pt just started eating with the R hand, but with effort and some mess Goal status: INITIAL  5.  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  Baseline: 11/30/2022: Right shoulder flexion 4+/, abduction 4/5, elbow flexion: 5/5 extension, forearm supination 5/5, pronation 5/5, wrist extension:5/5 R shoulder 4-, elbow and wrist 4+ (See LTG #2 for R hand strength); LUE 5/5 throughout  Goal status: INITIAL  ASSESSMENT: CLINICAL IMPRESSION: Pt intermittently tearful this date as his spouse had an appointment earlier today and is needing follow up for suspected ca.  Pt anticipates multiple medical appointments coming for spouse.  Discussed options for pt re: OT visits, including hold, d/c, recert as end of cert will be approaching in about 2 weeks.  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  Focus today on R hand FMC/dexterity skills, noting pt having difficulty storing small beads in palm (simulated pills) d/t weakness with securing a composite fist, particularly in digits 3-5.  Pt does have some R hand stiffness with flex and ext of all digits, and pt has been consistent with stretches daily at home.  Reviewed these stretches this date with good return demo.  Pt does better to store medium sided beads in palm and does well scooping multiple of this size into palm without dropping, but does require extra time for translatory movements to move beads from palm to fingertips in prep for discarding them into a pill bottle.  Pt verbalizes and demonstrated good ability to open various size pill tops this date, and was able to manage holding bottle  and/or twisting tops in the R hand.  Pt will  continue to benefit from skilled OT to address RUE weakness and coordination deficits in order to maximize independent and use of the R dominant arm for daily tasks, improve function for return to work, and improve QOL.  PERFORMANCE DEFICITS: in functional skills including ADLs, IADLs, coordination, dexterity, strength, Fine motor control, Gross motor control, decreased knowledge of use of DME, and UE functional use, cognitive skills including emotional, and psychosocial skills including coping strategies and routines and behaviors.   IMPAIRMENTS: are limiting patient from ADLs, IADLs, work, and leisure.   CO-MORBIDITIES: has no other co-morbidities that affects occupational performance. Patient will benefit from skilled OT to address above impairments and improve overall function.  MODIFICATION OR ASSISTANCE TO COMPLETE EVALUATION: No modification of tasks or assist necessary to complete an evaluation.  OT OCCUPATIONAL PROFILE AND HISTORY: Problem focused assessment: Including review of records relating to presenting problem.  CLINICAL DECISION MAKING: Moderate - several treatment options, min-mod task modification necessary  REHAB POTENTIAL: Good  EVALUATION COMPLEXITY: Moderate    PLAN:  OT FREQUENCY: 2x/week  OT DURATION: 12 weeks  PLANNED INTERVENTIONS: self care/ADL training, therapeutic exercise, therapeutic activity, neuromuscular re-education, manual therapy, passive range of motion, moist heat, cryotherapy, contrast bath, patient/family education, coping strategies training, and DME and/or AE instructions  RECOMMENDED OTHER SERVICES: None at this time  CONSULTED AND AGREED WITH PLAN OF CARE: Patient  PLAN FOR NEXT SESSION: HEP review, neuro re-ed and therapeutic exercises   Danelle Earthly, MS, OTR/L

## 2022-12-10 DIAGNOSIS — R1312 Dysphagia, oropharyngeal phase: Secondary | ICD-10-CM | POA: Diagnosis not present

## 2022-12-10 DIAGNOSIS — D3702 Neoplasm of uncertain behavior of tongue: Secondary | ICD-10-CM | POA: Diagnosis not present

## 2022-12-10 DIAGNOSIS — C069 Malignant neoplasm of mouth, unspecified: Secondary | ICD-10-CM | POA: Diagnosis not present

## 2022-12-11 ENCOUNTER — Ambulatory Visit: Payer: 59 | Admitting: Physical Therapy

## 2022-12-11 DIAGNOSIS — M6281 Muscle weakness (generalized): Secondary | ICD-10-CM

## 2022-12-11 DIAGNOSIS — R278 Other lack of coordination: Secondary | ICD-10-CM | POA: Diagnosis not present

## 2022-12-11 DIAGNOSIS — R269 Unspecified abnormalities of gait and mobility: Secondary | ICD-10-CM

## 2022-12-11 DIAGNOSIS — R2681 Unsteadiness on feet: Secondary | ICD-10-CM

## 2022-12-11 NOTE — Therapy (Signed)
OUTPATIENT PHYSICAL THERAPY NEURO EVALUATION   Patient Name: Eric Austin MRN: [id number] DOB:[birth year], 62 y.o., male Today's Date: 11/30/2022   PCP: Dr. Einar Crow REFERRING PROVIDER: Dr. Marygrace Drought  END OF SESSION:  PT End of Session - 11/30/22 0948     Visit Number 1    Number of Visits 24    Date for PT Re-Evaluation 02/22/23    Progress Note Due on Visit 10    PT Start Time 0942    PT Stop Time 1015    PT Time Calculation (min) 33 min    Equipment Utilized During Treatment Gait belt    Activity Tolerance Patient tolerated treatment well    Behavior During Therapy [facility] for tasks assessed/performed             History reviewed. No pertinent past medical history. Past Surgical History:  Procedure Laterality Date   COLONOSCOPY WITH PROPOFOL N/A 07/25/2020   Procedure: COLONOSCOPY WITH PROPOFOL;  Surgeon: Wyline Mood, MD;  Location: [facility] ENDOSCOPY;  Service: Gastroenterology;  Laterality: N/A;   HERNIA REPAIR     kidney removed     Patient Active Problem List   Diagnosis Date Noted   Primary hypertension 11/25/2021   Right foot pain 11/25/2021   Pulmonary nodule 09/29/2021   History of diverticulitis of colon 06/11/2020   Annual physical exam 05/15/2020   BPH without obstruction/lower urinary tract symptoms 05/15/2020   Primary osteoarthritis of right knee 05/15/2020   Irritable bowel syndrome with diarrhea 05/15/2020    ONSET DATE: 09/03/2022  REFERRING DIAG: I63.9 (ICD-10-CM) - Cerebral infarction, unspecified   THERAPY DIAG:  Unsteadiness on feet  Rationale for Evaluation and Treatment: Rehabilitation  SUBJECTIVE:                                                                                                                                                                                             SUBJECTIVE STATEMENT: No pain reported   Pt accompanied by: self  PERTINENT HISTORY: PER [facility] hospital [facility] Course from  09/03/2022:  Eric Austin is a 62 y.o. male with a past medical history of HTN who was transferred to [facility] on 09/03/2022 with acute stroke affecting left pyramidal tract. Initially, PT/OT recommended discharge to [facility]. However, his condition improved and ultimately PT/OT/ST recommended outpatient treatment 3x weekly.   Per Dr. Judie Petit. 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 Some right hemiparesis is left, partial and using a cane but improving some  Right hemiparesis (CMS-HCC) Partial hemipareisis post left pontine stroke  Restless legs syndrome Gabapentin helps   PAIN:  Are you having pain? No  PRECAUTIONS: None  WEIGHT BEARING RESTRICTIONS: No  FALLS: Has patient fallen in last 6 months? 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   PATIENT GOALS:  Just to get a little stronger so I don't wear out so quick.  OBJECTIVE:   DIAGNOSTIC FINDINGS: Transthoracic echocardiogram, 09/03/22: LV normal size with upper normal wall thickness. LVEF visually estimated at 55-60%. Aortic valve is trileaflet with mildly thickened leaflets with normal excursion. RV normal in size with normal function.  CTA Head and Neck, 09/03/22: No findings to suggest large vessel occlusion, as clinically questioned. No evidence of dissection, aneurysm or high-grade stenosis of the carotid or vertebral arteries.Small air locules in the right cavernous sinus and cervical epidural space likely representing iatrogenic venous gas embolism from IV access.  MRI Brain with and without contrast,  09/05/22: Acute to early subacute left pontine infarct. Chronic microvascular changes as characterized above.   COGNITION: Overall cognitive status: Within functional limits for tasks assessed   SENSATION: WFL  COORDINATION: Slight delay in heel to shin Right vs. Left  EDEMA:  None reported or observed in all extremities  MUSCLE TONE: RLE: Within functional limits RUE tone observed- minimal arm sway with gait   POSTURE: rounded shoulders and forward head  LOWER EXTREMITY ROM:      All Left and Right AROM = WNL with hips/knees/ankles   LOWER EXTREMITY MMT:    MMT Right Eval Left Eval  Hip flexion 4 5  Hip extension 4 5  Hip abduction 4 5  Hip adduction 5 5  Hip internal rotation 4 5  Hip external rotation 4 5  Knee flexion 4 5  Knee extension 4 5  Ankle dorsiflexion 4 5  Ankle plantarflexion    Ankle inversion    Ankle eversion    (Blank rows = not tested)  BED MOBILITY:  Not test- Patient reports independent  TRANSFERS: Assistive device utilized:  None   Sit to stand: Complete Independence Stand to sit: Complete Independence Chair to chair: Complete Independence Floor:  Not tested    STAIRS: Level of Assistance: SBA Stair Negotiation Technique: Alternating Pattern  with Bilateral Rails Number of Stairs: 4  Height of Stairs: 6"  Comments: No observed LOB- step through yet use of railing.  GAIT: Gait pattern: decreased arm swing- Right and decreased step length- Left Distance walked: 200+ feet Assistive device utilized:  None Level of assistance: CGA Comments:  No LOB or ataxia with normal walking- slow cadence/gait speed  FUNCTIONAL TESTS:  5 times sit to stand: 18.81 sec with  Timed up and go (TUG): 12.67 sec without an AD 10 meter walk test: 0.68 m/s Functional gait assessment: 24/30  PATIENT SURVEYS:  FOTO 80  TODAY'S TREATMENT:  DATE: 12/11/2022   Nustep BLE/BUE x 5 min level 2-5 with cues for full ROM on the RUE and consistent SPM throughout resistance variation .   SLS 2 x 10sec with UE support and 2 x 10 sec bil without UE support  Airex pad narrow BOS eyes open 15sec, eyes closed x 10 sec; no UE support   Blaze pod Standing on airex Random 4 pods (2 for RLE 2 for LLE) 2 x 1 min  Standing on airex random 3 for 1 LE, performed x  bil  Home base 5 pods on floor  x 1.5 min  The Blaze Pod Random setting was chosen to enhance cognitive processing and agility, providing an unpredictable environment to simulate real-world scenarios, and fostering quick reactions and adaptability.  The [facility] setting was selected for the goal of improving spatial awareness and visual scanning ability, establishing a central point of reference during dynamic movements for enhanced balance and control.   Min assist intermittently for narrow BOS, eyes closed and for SLS tasks, requiring greater assist on the RLE than LLE,        PATIENT EDUCATION: Education details: Purpose of PT for LE strength and balance; Plan of care discussed- set goals with patient.  Person educated: Patient Education method: Explanation, Demonstration, Tactile cues, and Verbal cues Education comprehension: verbalized understanding, returned demonstration, verbal cues required, tactile cues required, and needs further education  HOME EXERCISE PROGRAM: Access Code: [email or web address] URL: [email or web address] Date: 11/30/2022 Prepared by: Maureen Ralphs  Exercises - Single Leg Stance  - 2-3 x weekly - 5 sets - up to 10+ sec hold - Tandem Stance  - 2-3 x weekly - 3 sets - 20-30 sec hold     GOALS: Goals reviewed with patient? Yes  SHORT TERM GOALS: Target date: 01/11/2023  Pt will be independent with HEP in order to improve strength and balance in order to decrease fall risk and improve function at home and  work.   Baseline: EVAL- Patient has no formal HEP in place Goal status: INITIAL    LONG TERM GOALS: Target date: 02/22/2023  Pt will improve FOTO to target score of 85 to display perceived improvements in ability to complete ADL's.  Baseline: 80 Goal status: INITIAL  2.  Pt will increase by at least 0.13 m/s in order to demonstrate clinically significant improvement in community ambulation.   Baseline: EVAL= 0.68 m/s Goal status: INITIAL  3.  Pt will improve FGA by at least 3 points in order to demonstrate clinically significant improvement in balance and decreased risk for falls. Baseline: EVAL= 24/30 Goal status: INITIAL  4.  Pt will decrease 5TSTS by at least 3 seconds in order to demonstrate clinically significant improvement in LE strength. Baseline: EVAL= 18.81 sec without UE support Goal status: INITIAL   ASSESSMENT:  CLINICAL IMPRESSION:  Patient tolerated treatment well. Put forth excellent effort towards PT treatment to address balance deficits. FOTO score assessed to 80%. Noted to have decreased stability on unlevel surface and with eyes closed. Intermittent min assist from PT to prevent Lateral LOB while standing on the RLE with Blaze pod task and cues for use of ankle strategy to prevent LOB.  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    OBJECTIVE IMPAIRMENTS: Abnormal gait, decreased balance, decreased coordination, decreased endurance, decreased mobility, difficulty walking, and decreased strength.  ACTIVITY LIMITATIONS: lifting, bending, standing, squatting, and stairs  PARTICIPATION LIMITATIONS: cleaning, community activity, occupation, and yard work  PERSONAL FACTORS: 1 comorbidity: HTN  are also affecting patient's functional outcome.   REHAB POTENTIAL: Excellent  CLINICAL DECISION MAKING: Stable/uncomplicated  EVALUATION  COMPLEXITY: Low  PLAN:  PT FREQUENCY: 1-2x/week  PT DURATION: 12 weeks  PLANNED INTERVENTIONS: Therapeutic exercises, Therapeutic activity, Neuromuscular re-education, Balance training, Gait training, Patient/Family education, Self Care, Joint mobilization, Joint manipulation, Stair training, Vestibular training, Canalith repositioning, DME instructions, Dry Needling, Electrical stimulation, Spinal manipulation, Spinal mobilization, Cryotherapy, Moist heat, Manual therapy, and Re-evaluation  PLAN FOR NEXT SESSION:   Instruct in LE strengthening and higher level balance next session.  Re-assess HEP and advance as appropriate.   Grier Rocher PT, DPT  Physical Therapist - [place]  [facility]  10:21 AM 12/11/22

## 2022-12-14 ENCOUNTER — Ambulatory Visit: Payer: 59 | Attending: Student

## 2022-12-14 ENCOUNTER — Ambulatory Visit: Payer: 59 | Admitting: Physical Therapy

## 2022-12-14 DIAGNOSIS — R269 Unspecified abnormalities of gait and mobility: Secondary | ICD-10-CM

## 2022-12-14 DIAGNOSIS — R2681 Unsteadiness on feet: Secondary | ICD-10-CM | POA: Diagnosis present

## 2022-12-14 DIAGNOSIS — M6281 Muscle weakness (generalized): Secondary | ICD-10-CM | POA: Diagnosis present

## 2022-12-14 DIAGNOSIS — R278 Other lack of coordination: Secondary | ICD-10-CM | POA: Diagnosis present

## 2022-12-14 NOTE — Therapy (Signed)
Occupational Therapy Neuro Treatment Note  Patient Name: Eric Austin MRN: [id number] DOB:[birth year], 62 y.o., male Today's Date: 12/14/2022  PCP: Dr. Einar Crow REFERRING PROVIDER: Dr. Almon Hercules  END OF SESSION:  OT End of Session - 12/14/22 1442     Visit Number 12    Number of Visits 24    Date for OT Re-Evaluation 12/24/22    Progress Note Due on Visit 10    OT Start Time 1430    OT Stop Time 1515    OT Time Calculation (min) 45 min    Equipment Utilized During Treatment none    Activity Tolerance Patient tolerated treatment well    Behavior During Therapy [facility] for tasks assessed/performed            No past medical history on file. Past Surgical History:  Procedure Laterality Date   COLONOSCOPY WITH PROPOFOL N/A 07/25/2020   Procedure: COLONOSCOPY WITH PROPOFOL;  Surgeon: Wyline Mood, MD;  Location: [facility] ENDOSCOPY;  Service: Gastroenterology;  Laterality: N/A;   HERNIA REPAIR     kidney removed     Patient Active Problem List   Diagnosis Date Noted   Primary hypertension 11/25/2021   Right foot pain 11/25/2021   Pulmonary nodule 09/29/2021   History of diverticulitis of colon 06/11/2020   Annual physical exam 05/15/2020   BPH without obstruction/lower urinary tract symptoms 05/15/2020   Primary osteoarthritis of right knee 05/15/2020   Irritable bowel syndrome with diarrhea 05/15/2020    ONSET DATE: Feb. 2024  REFERRING DIAG: I63.9 (ICD-10-CM) - Cerebral infarction, unspecified   THERAPY DIAG:  Muscle weakness (generalized)  Other lack of coordination  Rationale for Evaluation and Treatment: Rehabilitation  SUBJECTIVE:  SUBJECTIVE STATEMENT: Pt reports that his R hand continues to feel stiff but that he's stretching it routinely throughout the day.  Pt accompanied by: significant other  PERTINENT HISTORY:  Per [facility] chart, Eric Austin is a 62 y.o. male with a past medical history of HTN who was transferred to [facility] on 09/03/2022 with  acute stroke affecting left pyramidal tract. Initially, PT/OT recommended discharge to [facility]. However, his condition improved and ultimately PT/OT/ST recommended outpatient treatment 3x weekly.   PRECAUTIONS: None  WEIGHT BEARING RESTRICTIONS: No  PAIN:  Are you having pain? no  FALLS: Has patient fallen in last 6 months? No  LIVING ENVIRONMENT: Lives with: lives with their spouse Lives in: 1 level home Stairs: Yes: External: 4 steps; on right going up Has following equipment at home:  was using a 4 pronged cane but no longer having to use it, built in shower seat   PLOF: Independent, full time landscaping  PATIENT GOALS: Get my right hand stronger and be able to write my name and eat with my right hand. OBJECTIVE:   HAND DOMINANCE: Right  ADLs: Overall ADLs: extra time; difficulty using the R hand Transfers/ambulation related to ADLs: indep with dressing/bathing Eating: just started eating with the R hand but with difficulty Grooming: pt tries a little with the R, but has to use L mostly UB Dressing: difficulty with clothing fasteners LB Dressing: difficulty with clothing fasteners Toileting: indep Bathing: indep Tub Shower transfers: indep Equipment:  see above  IADLs: Shopping: supv Light housekeeping: with effort Meal Prep: cooking on the [employer] mobility: ambulatory without AD Medication management: supv, occasional reminders Financial management: indep Handwriting: 100% legible and Increased time, pt reports almost to his baseline with handwriting.   MOBILITY STATUS:  ambulatory without AD  POSTURE COMMENTS:  No Significant postural limitations Sitting balance: Moves/returns truncal midpoint >2 inches in all planes  ACTIVITY TOLERANCE: Activity tolerance: WFL for assessment activities  FUNCTIONAL OUTCOME MEASURES: FOTO: 65; predicted 73  UPPER EXTREMITY ROM:  BUEs [facility]  11/30/2022:   Digit flexion to the [facility]:  2nd:4 cm (0) 3rd: 3.5 cm (0) 4th:  3cm (0) 5th: 2.5cm (0)  UPPER EXTREMITY MMT:     MMT Right eval Right  11/30/2022 Left eval  Shoulder flexion 4- 4+ 5  Shoulder abduction 4- 4 5  Shoulder adduction     Shoulder extension     Shoulder internal rotation     Shoulder external rotation     Middle trapezius     Lower trapezius     Elbow flexion 4+ 5 5  Elbow extension 4+ 5 5  Wrist flexion 4+ 5 5  Wrist extension 4+ 5 5  Wrist ulnar deviation     Wrist radial deviation     Wrist pronation  5   Wrist supination  5   (Blank rows = not tested)  HAND FUNCTION: Grip strength: Right: 37 lbs; Left: 64 lbs, Lateral pinch: Right: 11 lbs, Left: 18 lbs, and 3 point pinch: Right: 13 lbs, Left: 20 lbs  11/30/2022  Grip strength: Right: 25 lbs; Left: 64 lbs, Lateral pinch: Right: 14 lbs, Left: 18 lbs, and 3 point pinch: Right: 11 lbs, Left: 20 lbs   COORDINATION: Finger Nose Finger test: good, slightly slower than L 9 Hole Peg test: Right: 35 sec; Left: 25 sec  11/30/2022  9 Hole Peg test: Right: 33 sec; Left: 25 sec  SENSATION: WFL Light touch: WFL  EDEMA: none  MUSCLE TONE: RUE: Within functional limits  COGNITION: Overall cognitive status: mood lability; pt intermittently tearful x2 during eval  VISION: Subjective report: Pt reports that he wears hard contacts and only put a contact in the L eye today d/t difficulty.  Pt reports denies any changes in vision since CVA.  VISION ASSESSMENT: Ocular ROM: WFL Tracking/Visual pursuits: Able to track stimulus in all quads without difficulty Saccades: WFL Visual Fields: no apparent deficits  PERCEPTION: WFL  PRAXIS: Impaired: Motor planning  OBSERVATIONS: Pt making good efforts to engage the R hand for all assessment activities.   TODAY'S TREATMENT:  Neuro re-ed: Facilitated R hand FMC/dexterity skills placing ball pegs into pegboard, picking up pegs from stick side, rotating peg within fingertips to hold from ball side in prep to place pegs into pegboard.   Pt practiced item storage and translatory skills picking up a row of pegs, storing in hand, and discarding them back into container.                                                                                                                              Therapeutic Exercise: Reviewed/completed intrinsic stretches and performed passive MP, PIP, and DIP flex/ext and wrist ext stretches for R hand digits, working to decrease stiffness in  R hand and promote full composite fist.  Facilitated hand strengthening with use of hand gripper set at 17.9# for 1 trial and 23.4# for 2 trials to remove jumbo pegs from pegboard using R hand for 3 trials total.  Pt required set up of gripper in hand at base of gripper to rest the R IF in groove for better engagement of this finger into each squeeze.    PATIENT EDUCATION: Education details: Self passive stretching for R wrist and digit extension to reduce flexor tightness, self passive digit flexion for increasing composite fist. Person educated: Patient Education method: Explanation and Verbal cues, demo Education comprehension: able to return demo  HOME EXERCISE PROGRAM:  Left hand fisting, tendon glide, and intrinsic stretches.  GOALS: Goals reviewed with patient? Yes  SHORT TERM GOALS: Target date: 01/11/2023    Pt will be indep to perform HEP for improving strength and coordination throughout the RUE. Baseline: 11/30/2022: Independent. Eval: initiate putty exercises at eval, further training needed Goal status: Ongoing  LONG TERM GOALS: Target date: 12/24/22 (12 weeks)  Pt will increase FOTO score to 73 or better to indicate improvement in self perceived functional use of the R arm with daily tasks. Baseline: 11/30/2022:73 TR score: 73 Eval: 65 Goal status: Achieved  2.  Pt will increase R grip strength by 20 or more lbs in order to improve ability to hold and carry ADL supplies.   Baseline: 11/30/2022: R: 25# Eval: R 37 (L 64) Goal status:  Ongoing  3.  Pt will improve FMC/dexterity skills in R  hand to be able to pick up coins or pills from a table with greater ease.  Baseline: 11/30/2022: R: 33 sec. Eval: with difficulty/increased time (9 hole peg test R 35 sec, L 25 sec) Goal status: INITIAL  4.  Pt will use the R hand to eat with good accuracy and adapted utensils as needed. Baseline: 11/30/2022: Pt. Is progressing with self feeding, handling utensils. Eval: pt just started eating with the R hand, but with effort and some mess Goal status: INITIAL  5.  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  Baseline: 11/30/2022: Right shoulder flexion 4+/, abduction 4/5, elbow flexion: 5/5 extension, forearm supination 5/5, pronation 5/5, wrist extension:5/5 R shoulder 4-, elbow and wrist 4+ (See LTG #2 for R hand strength); LUE 5/5 throughout  Goal status: INITIAL  ASSESSMENT: CLINICAL IMPRESSION: Pt reports that his R hand continues to feel stiff but that he's stretching it routinely throughout the day.  D/t weakness and stiffness, pt required set up of gripper in hand at base of gripper to rest the R IF in groove for better engagement of this finger into each squeeze.  Pt tolerated up to 23.4# on hand gripper this date.  Pt is improving with R hand FMC/dexterity skills, demonstrated ability to place and remove ball pegs and reposition within fingertips with few dropped pegs.  Increased challenge with manipulation and storage of smaller items in palm d/t difficulty closing fingertips to palm tightly.  Pt will continue to benefit from skilled OT to address RUE weakness and coordination deficits in order to maximize independent and use of the R dominant arm for daily tasks, improve function for return to work, and improve QOL.  PERFORMANCE DEFICITS: in functional skills including ADLs, IADLs, coordination, dexterity, strength, Fine motor control, Gross motor  control, decreased knowledge of use of DME, and UE functional use, cognitive skills  including emotional, and psychosocial skills including coping strategies and routines and behaviors.   IMPAIRMENTS: are limiting patient from ADLs, IADLs, work, and leisure.   CO-MORBIDITIES: has no other co-morbidities that affects occupational performance. Patient will benefit from skilled OT to address above impairments and improve overall function.  MODIFICATION OR ASSISTANCE TO COMPLETE EVALUATION: No modification of tasks or assist necessary to complete an evaluation.  OT OCCUPATIONAL PROFILE AND HISTORY: Problem focused assessment: Including review of records relating to presenting problem.  CLINICAL DECISION MAKING: Moderate - several treatment options, min-mod task modification necessary  REHAB POTENTIAL: Good  EVALUATION COMPLEXITY: Moderate    PLAN:  OT FREQUENCY: 2x/week  OT DURATION: 12 weeks  PLANNED INTERVENTIONS: self care/ADL training, therapeutic exercise, therapeutic activity, neuromuscular re-education, manual therapy, passive range of motion, moist heat, cryotherapy, contrast bath, patient/family education, coping strategies training, and DME and/or AE instructions  RECOMMENDED OTHER SERVICES: None at this time  CONSULTED AND AGREED WITH PLAN OF CARE: Patient  PLAN FOR NEXT SESSION: HEP review, neuro re-ed and therapeutic exercises   Danelle Earthly, MS, OTR/L

## 2022-12-14 NOTE — Therapy (Signed)
OUTPATIENT PHYSICAL THERAPY NEURO EVALUATION   Patient Name: Eric Austin MRN: [id number] DOB:[birth year], 62 y.o., male Today's Date: 12/14/2022   PCP: Dr. Einar Crow REFERRING PROVIDER: Dr. Marygrace Drought  END OF SESSION:  PT End of Session - 12/14/22 1541     Visit Number 3    Number of Visits 24    Date for PT Re-Evaluation 02/22/23    Progress Note Due on Visit 10    PT Start Time 1517    PT Stop Time 1559    PT Time Calculation (min) 42 min    Equipment Utilized During Treatment Gait belt    Activity Tolerance Patient tolerated treatment well    Behavior During Therapy [facility] for tasks assessed/performed              No past medical history on file. Past Surgical History:  Procedure Laterality Date   COLONOSCOPY WITH PROPOFOL N/A 07/25/2020   Procedure: COLONOSCOPY WITH PROPOFOL;  Surgeon: Wyline Mood, MD;  Location: [facility] ENDOSCOPY;  Service: Gastroenterology;  Laterality: N/A;   HERNIA REPAIR     kidney removed     Patient Active Problem List   Diagnosis Date Noted   Primary hypertension 11/25/2021   Right foot pain 11/25/2021   Pulmonary nodule 09/29/2021   History of diverticulitis of colon 06/11/2020   Annual physical exam 05/15/2020   BPH without obstruction/lower urinary tract symptoms 05/15/2020   Primary osteoarthritis of right knee 05/15/2020   Irritable bowel syndrome with diarrhea 05/15/2020    ONSET DATE: 09/03/2022  REFERRING DIAG: I63.9 (ICD-10-CM) - Cerebral infarction, unspecified   THERAPY DIAG:  No diagnosis found.  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   Pt accompanied by: self  PERTINENT HISTORY: PER [facility] hospital [facility] Course from 09/03/2022:  Eric Austin is a 62 y.o. male with a past medical history of HTN who was transferred to [facility] on 09/03/2022 with acute stroke affecting left pyramidal tract. Initially, PT/OT recommended discharge to [facility]. However, his condition improved and ultimately PT/OT/ST recommended outpatient treatment 3x weekly.   Per Dr. Judie Petit. 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 Some right hemiparesis is left, partial and using a cane but improving some  Right hemiparesis (CMS-HCC) Partial hemipareisis post left pontine stroke  Restless legs syndrome Gabapentin helps   PAIN:  Are you having pain? No  PRECAUTIONS: None  WEIGHT BEARING RESTRICTIONS: No  FALLS: Has patient fallen in last 6 months? 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   PATIENT GOALS:  Just to get a little stronger so I don't wear out so quick.  OBJECTIVE:   DIAGNOSTIC FINDINGS: Transthoracic echocardiogram, 09/03/22: LV normal size with upper normal wall thickness. LVEF visually estimated at 55-60%. Aortic valve is trileaflet with mildly thickened leaflets with normal excursion. RV normal in size with normal function.  CTA Head and Neck, 09/03/22: No findings to suggest large vessel occlusion, as clinically questioned. No evidence of dissection, aneurysm or high-grade stenosis of the carotid or vertebral arteries.Small air locules in the right cavernous  sinus and cervical epidural space likely representing iatrogenic venous gas embolism from IV access.  MRI Brain with and without contrast, 09/05/22: Acute to early subacute left pontine infarct. Chronic microvascular changes as characterized above.   COGNITION: Overall cognitive status: Within functional limits for tasks assessed   SENSATION: WFL  COORDINATION: Slight delay in heel to shin Right vs. Left  EDEMA:  None reported or observed in all extremities  MUSCLE TONE: RLE: Within functional limits RUE tone observed- minimal arm sway with gait   POSTURE: rounded shoulders and forward head  LOWER EXTREMITY ROM:      All Left and Right AROM = WNL with hips/knees/ankles   LOWER EXTREMITY MMT:    MMT Right Eval Left Eval  Hip flexion 4 5  Hip extension 4 5  Hip abduction 4 5  Hip adduction 5 5  Hip internal rotation 4 5  Hip external rotation 4 5  Knee flexion 4 5  Knee extension 4 5  Ankle dorsiflexion 4 5  Ankle plantarflexion    Ankle inversion    Ankle eversion    (Blank rows = not tested)  BED MOBILITY:  Not test- Patient reports independent  TRANSFERS: Assistive device utilized:  None   Sit to stand: Complete Independence Stand to sit: Complete Independence Chair to chair: Complete Independence Floor:  Not tested    STAIRS: Level of Assistance: SBA Stair Negotiation Technique: Alternating Pattern  with Bilateral Rails Number of Stairs: 4  Height of Stairs: 6"  Comments: No observed LOB- step through yet use of railing.  GAIT: Gait pattern: decreased arm swing- Right and decreased step length- Left Distance walked: 200+ feet Assistive device utilized:  None Level of assistance: CGA Comments:  No LOB or ataxia with normal walking- slow cadence/gait speed  FUNCTIONAL TESTS:  5 times sit to stand: 18.81 sec with  Timed up and go (TUG): 12.67 sec without an AD 10 meter walk test: 0.68 m/s Functional gait assessment: 24/30  PATIENT SURVEYS:   FOTO 80  TODAY'S TREATMENT:  DATE: 12/14/2022  TE  Nustep LE only  Level 1 : level 3 x 1 round  Level 2 : level 4 x 2 rounds  NMR  Airex NBOS with EC x 30 sec - 1/2 romberg stance 2 x 30 sec ea LE   Balance obstacle course: airex pad, treadmill step up, walk on / over 1/2 foam, airex beam, step trainer (plank walk) x 2 rounds, no LOB or significant difficulty, cues to increase speed to simulate pt rushing in everyday life -increased height of step, combines balance beam with stepping on 3 1/2 foam rollers in succession and increased height of step trainer x 2 rounds, good balance responses with all activities   Airex lateral step up and step down.  X 10 reps to each side followed by Airex forward and retrostep up and down x 10 reps on each lower extremity.  Good balance responses throughout.  Patient performs all activities well reports his most significant difficulty remains with standing for prolonged periods in between eating activity.  He was provided with a way to alter this by using a strap around his arm and he says he will attempt this in future creating activities.  Will consider some core stabilization combined with balance activities next session.  Patient is probably getting close to discharge based on his goals and his current functional status and this will be assessed at future sessions.      PATIENT EDUCATION: Education details: Purpose of PT for LE strength and balance; Plan of care discussed- set goals with patient.  Person educated: Patient Education method: Explanation, Demonstration, Tactile cues, and Verbal cues Education comprehension: verbalized understanding, returned demonstration, verbal cues required, tactile cues required, and needs further education  HOME EXERCISE PROGRAM: Access Code: [email or web address] URL:  [email or web address] Date: 11/30/2022 Prepared by: Maureen Ralphs  Exercises - Single Leg Stance  - 2-3 x weekly - 5 sets - up to 10+ sec hold - Tandem Stance  - 2-3 x weekly - 3 sets - 20-30 sec hold     GOALS: Goals reviewed with patient? Yes  SHORT TERM GOALS: Target date: 01/11/2023  Pt will be independent with HEP in order to improve strength and balance in order to decrease fall risk and improve function at home and work.   Baseline: EVAL- Patient has no formal HEP in place Goal status: INITIAL    LONG TERM GOALS: Target date: 02/22/2023  Pt will improve FOTO to target score of 85 to display perceived improvements in ability to complete ADL's.  Baseline: 80 Goal status: INITIAL  2.  Pt will increase by at least 0.13 m/s in order to demonstrate clinically significant improvement in community ambulation.   Baseline: EVAL= 0.68 m/s Goal status: INITIAL  3.  Pt will improve FGA by at least 3 points in order to demonstrate clinically significant improvement in balance and decreased risk for falls. Baseline: EVAL= 24/30 Goal status: INITIAL  4.  Pt will decrease 5TSTS by at least 3 seconds in order to demonstrate clinically significant improvement in LE strength. Baseline: EVAL= 18.81 sec without UE support Goal status: INITIAL   ASSESSMENT:  CLINICAL IMPRESSION:    Patient performs all activities well reports his most significant difficulty remains with standing for prolonged periods in between eating activity.  He was provided with a way to alter this by using a strap around his arm and he says he will attempt this in future creating activities.  Will consider some core stabilization combined with balance activities  next session.  Patient is probably getting close to discharge based on his goals and his current functional status and this will be assessed at future sessions. Pt will continue to benefit from skilled physical therapy intervention to  address impairments, improve QOL, and attain therapy goals.    OBJECTIVE IMPAIRMENTS: Abnormal gait, decreased balance, decreased coordination, decreased endurance, decreased mobility, difficulty walking, and decreased strength.   ACTIVITY LIMITATIONS: lifting, bending, standing, squatting, and stairs  PARTICIPATION LIMITATIONS: cleaning, community activity, occupation, and yard work  PERSONAL FACTORS: 1 comorbidity: HTN  are also affecting patient's functional outcome.   REHAB POTENTIAL: Excellent  CLINICAL DECISION MAKING: Stable/uncomplicated  EVALUATION COMPLEXITY: Low  PLAN:  PT FREQUENCY: 1-2x/week  PT DURATION: 12 weeks  PLANNED INTERVENTIONS: Therapeutic exercises, Therapeutic activity, Neuromuscular re-education, Balance training, Gait training, Patient/Family education, Self Care, Joint mobilization, Joint manipulation, Stair training, Vestibular training, Canalith repositioning, DME instructions, Dry Needling, Electrical stimulation, Spinal manipulation, Spinal mobilization, Cryotherapy, Moist heat, Manual therapy, and Re-evaluation  PLAN FOR NEXT SESSION:   Instruct in LE strengthening and higher level balance next session.  Re-assess HEP and advance as appropriate. Add some care stabilization to his program combines with balance, assess his progression in returning to work.   Norman Herrlich PT ,DPT Physical Therapist- [facility]  3:42 PM 12/14/22

## 2022-12-17 ENCOUNTER — Ambulatory Visit: Payer: 59 | Admitting: Physical Therapy

## 2022-12-17 ENCOUNTER — Ambulatory Visit: Payer: 59

## 2022-12-17 DIAGNOSIS — M6281 Muscle weakness (generalized): Secondary | ICD-10-CM | POA: Diagnosis not present

## 2022-12-17 DIAGNOSIS — R278 Other lack of coordination: Secondary | ICD-10-CM

## 2022-12-17 DIAGNOSIS — R269 Unspecified abnormalities of gait and mobility: Secondary | ICD-10-CM

## 2022-12-17 DIAGNOSIS — R2681 Unsteadiness on feet: Secondary | ICD-10-CM

## 2022-12-17 NOTE — Therapy (Signed)
Occupational Therapy Neuro Treatment Note  Patient Name: Eric Austin MRN: [id number] DOB:01-06-1961, 62 y.o., male Today's Date: 12/20/2022  PCP: Dr. Einar Crow REFERRING PROVIDER: Dr. Almon Hercules  END OF SESSION:  OT End of Session - 12/20/22 1104     Visit Number 13    Number of Visits 24    Date for OT Re-Evaluation 12/24/22    Progress Note Due on Visit 10    OT Start Time 1645    OT Stop Time 1730    OT Time Calculation (min) 45 min    Equipment Utilized During Treatment none    Activity Tolerance Patient tolerated treatment well    Behavior During Therapy [facility] for tasks assessed/performed             No past medical history on file. Past Surgical History:  Procedure Laterality Date   COLONOSCOPY WITH PROPOFOL N/A 07/25/2020   Procedure: COLONOSCOPY WITH PROPOFOL;  Surgeon: Wyline Mood, MD;  Location: [facility] ENDOSCOPY;  Service: Gastroenterology;  Laterality: N/A;   HERNIA REPAIR     kidney removed     Patient Active Problem List   Diagnosis Date Noted   Primary hypertension 11/25/2021   Right foot pain 11/25/2021   Pulmonary nodule 09/29/2021   History of diverticulitis of colon 06/11/2020   Annual physical exam 05/15/2020   BPH without obstruction/lower urinary tract symptoms 05/15/2020   Primary osteoarthritis of right knee 05/15/2020   Irritable bowel syndrome with diarrhea 05/15/2020    ONSET DATE: Feb. 2024  REFERRING DIAG: I63.9 (ICD-10-CM) - Cerebral infarction, unspecified   THERAPY DIAG:  Muscle weakness (generalized)  Other lack of coordination  Rationale for Evaluation and Treatment: Rehabilitation  SUBJECTIVE:  SUBJECTIVE STATEMENT: Pt reports that he's finding it difficulty to pinch his pocket knife open using his R hand d/t residual weakness.   Pt accompanied by: significant other  PERTINENT HISTORY:  Per [facility] chart, Eric Austin is a 62 y.o. male with a past medical history of HTN who was transferred to [facility] on  09/03/2022 with acute stroke affecting left pyramidal tract. Initially, PT/OT recommended discharge to [facility]. However, his condition improved and ultimately PT/OT/ST recommended outpatient treatment 3x weekly.   PRECAUTIONS: None  WEIGHT BEARING RESTRICTIONS: No  PAIN:  Are you having pain? no  FALLS: Has patient fallen in last 6 months? No  LIVING ENVIRONMENT: Lives with: lives with their spouse Lives in: 1 level home Stairs: Yes: External: 4 steps; on right going up Has following equipment at home:  was using a 4 pronged cane but no longer having to use it, built in shower seat   PLOF: Independent, full time landscaping  PATIENT GOALS: Get my right hand stronger and be able to write my name and eat with my right hand. OBJECTIVE:   HAND DOMINANCE: Right  ADLs: Overall ADLs: extra time; difficulty using the R hand Transfers/ambulation related to ADLs: indep with dressing/bathing Eating: just started eating with the R hand but with difficulty Grooming: pt tries a little with the R, but has to use L mostly UB Dressing: difficulty with clothing fasteners LB Dressing: difficulty with clothing fasteners Toileting: indep Bathing: indep Tub Shower transfers: indep Equipment:  see above  IADLs: Shopping: supv Light housekeeping: with effort Meal Prep: cooking on the [employer] mobility: ambulatory without AD Medication management: supv, occasional reminders Financial management: indep Handwriting: 100% legible and Increased time, pt reports almost to his baseline with handwriting.   MOBILITY STATUS:  ambulatory without AD  POSTURE COMMENTS:  No Significant postural limitations Sitting balance: Moves/returns truncal midpoint >2 inches in all planes  ACTIVITY TOLERANCE: Activity tolerance: WFL for assessment activities  FUNCTIONAL OUTCOME MEASURES: FOTO: 65; predicted 73  UPPER EXTREMITY ROM:  [facility]  11/30/2022:   Digit flexion to the [facility]:  2nd:4 cm (0) 3rd:  3.5 cm (0) 4th: 3cm (0) 5th: 2.5cm (0)  UPPER EXTREMITY MMT:     MMT Right eval Right  11/30/2022 Left eval  Shoulder flexion 4- 4+ 5  Shoulder abduction 4- 4 5  Shoulder adduction     Shoulder extension     Shoulder internal rotation     Shoulder external rotation     Middle trapezius     Lower trapezius     Elbow flexion 4+ 5 5  Elbow extension 4+ 5 5  Wrist flexion 4+ 5 5  Wrist extension 4+ 5 5  Wrist ulnar deviation     Wrist radial deviation     Wrist pronation  5   Wrist supination  5   (Blank rows = not tested)  HAND FUNCTION: Grip strength: Right: 37 lbs; Left: 64 lbs, Lateral pinch: Right: 11 lbs, Left: 18 lbs, and 3 point pinch: Right: 13 lbs, Left: 20 lbs  11/30/2022 Grip strength: Right: 25 lbs; Left: 64 lbs, Lateral pinch: Right: 14 lbs, Left: 18 lbs, and 3 point pinch: Right: 11 lbs, Left: 20 lbs  12/17/22: Grip strength: R 37 lbs; Lateral pinch: R: 16 lbs; 3 point pinch: R: 13 lbs  COORDINATION: Finger Nose Finger test: good, slightly slower than L 9 Hole Peg test: Right: 35 sec; Left: 25 sec  11/30/2022  9 Hole Peg test: Right: 33 sec; Left: 25 sec  SENSATION: WFL Light touch: WFL  EDEMA: none  MUSCLE TONE: RUE: Within functional limits  COGNITION: Overall cognitive status: mood lability; pt intermittently tearful x2 during eval  VISION: Subjective report: Pt reports that he wears hard contacts and only put a contact in the L eye today d/t difficulty.  Pt reports denies any changes in vision since CVA.  VISION ASSESSMENT: Ocular ROM: WFL Tracking/Visual pursuits: Able to track stimulus in all quads without difficulty Saccades: WFL Visual Fields: no apparent deficits  PERCEPTION: WFL  PRAXIS: Impaired: Motor planning  OBSERVATIONS: Pt making good efforts to engage the R hand for all assessment activities.   TODAY'S TREATMENT:  Neuro re-ed: Facilitated R hand FMC/dexterity skills working to pick 1.5 cm alphabet dice in R hand from table  top.  Pt practiced manipulation skills with 2-4 dice in hand at a time, moving dice from palm to fingertips and repositioning dice to find a designated letter without dropping the stored dice in palm.                                                                                                              Therapeutic Exercise: Facilitated hand strengthening with use of hand gripper set at 17.9# for 1 trial, 23.4# for 3 trials, and 28.9# for 1/2 trial (reduced  back to 23.4# for the last 1/2 of the 4th trial) to remove jumbo pegs from pegboard using R hand for 4 trials total.  Pt required set up of gripper in hand at base of gripper to rest the R IF in groove for better engagement of this finger into each squeeze.  Facilitated pinch strengthening with use of therapy resistant clothespins to target lateral and 3 point pinch of R hand.  Pt worked with all colors for each pinch type, 2 trials for each color and pinch type, clipping pins on/off a vertical dowel; min vc for pinch patterns.  PATIENT EDUCATION: Education details: Self passive stretching for R wrist and digit extension to reduce flexor tightness, self passive digit flexion for increasing composite fist. Person educated: Patient Education method: Explanation and Verbal cues, demo Education comprehension: able to return demo  HOME EXERCISE PROGRAM:  Left hand fisting, tendon glide, and intrinsic stretches.  GOALS: Goals reviewed with patient? Yes  SHORT TERM GOALS: Target date: 01/11/2023  Pt will be indep to perform HEP for improving strength and coordination throughout the RUE. Baseline: 11/30/2022: Independent. Eval: initiate putty exercises at eval, further training needed Goal status: Ongoing  LONG TERM GOALS: Target date: 12/24/22 (12 weeks)  Pt will increase FOTO score to 73 or better to indicate improvement in self perceived functional use of the R arm with daily tasks. Baseline: 11/30/2022:73 TR score: 73 Eval: 65 Goal status:  Achieved  2.  Pt will increase R grip strength by 20 or more lbs in order to improve ability to hold and carry ADL supplies.   Baseline: 11/30/2022: R: 25# Eval: R 37 (L 64) Goal status: Ongoing  3.  Pt will improve FMC/dexterity skills in R  hand to be able to pick up coins or pills from a table with greater ease.  Baseline: 11/30/2022: R: 33 sec. Eval: with difficulty/increased time (9 hole peg test R 35 sec, L 25 sec) Goal status: INITIAL  4.  Pt will use the R hand to eat with good accuracy and adapted utensils as needed. Baseline: 11/30/2022: Pt. Is progressing with self feeding, handling utensils. Eval: pt just started eating with the R hand, but with effort and some mess Goal status: INITIAL  5.  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  Baseline: 11/30/2022: Right shoulder flexion 4+/, abduction 4/5, elbow flexion: 5/5 extension, forearm supination 5/5, pronation 5/5, wrist extension:5/5 R shoulder 4-, elbow and wrist 4+ (See LTG #2 for R hand strength); LUE 5/5 throughout  Goal status: INITIAL  ASSESSMENT: CLINICAL IMPRESSION: Good tolerance to all therapeutic exercises and neuro re-ed activities this date.  Pt making steady gains with R hand FMC/dexterity skills, and was able to manipulate up to 4 alphabet dice in palm and fingertips with few dropped dice.  Pt was able to progress up to 28.9 # on the hand gripper this date for 1/2 of a set, reducing back down to 23.4# d/t hand fatigue by the 4th set.  Pt reports that he's finding it difficulty to pinch his pocket knife open using his R hand d/t residual weakness, so will continue focus on pinch strengthening exercises.  Pt completed high reps with clothespins today to target pinch.  Pt will continue to benefit from skilled OT to address RUE weakness and coordination deficits in order to maximize independent and use of the R dominant arm for daily tasks,  improve function for return  to work, and improve QOL.  PERFORMANCE DEFICITS: in functional skills including ADLs, IADLs, coordination, dexterity, strength, Fine motor control, Gross motor control, decreased knowledge of use of DME, and UE functional use, cognitive skills including emotional, and psychosocial skills including coping strategies and routines and behaviors.   IMPAIRMENTS: are limiting patient from ADLs, IADLs, work, and leisure.   CO-MORBIDITIES: has no other co-morbidities that affects occupational performance. Patient will benefit from skilled OT to address above impairments and improve overall function.  MODIFICATION OR ASSISTANCE TO COMPLETE EVALUATION: No modification of tasks or assist necessary to complete an evaluation.  OT OCCUPATIONAL PROFILE AND HISTORY: Problem focused assessment: Including review of records relating to presenting problem.  CLINICAL DECISION MAKING: Moderate - several treatment options, min-mod task modification necessary  REHAB POTENTIAL: Good  EVALUATION COMPLEXITY: Moderate    PLAN:  OT FREQUENCY: 2x/week  OT DURATION: 12 weeks  PLANNED INTERVENTIONS: self care/ADL training, therapeutic exercise, therapeutic activity, neuromuscular re-education, manual therapy, passive range of motion, moist heat, cryotherapy, contrast bath, patient/family education, coping strategies training, and DME and/or AE instructions  RECOMMENDED OTHER SERVICES: None at this time  CONSULTED AND AGREED WITH PLAN OF CARE: Patient  PLAN FOR NEXT SESSION: HEP review, neuro re-ed and therapeutic exercises  Danelle Earthly, MS, OTR/L

## 2022-12-17 NOTE — Therapy (Signed)
OUTPATIENT PHYSICAL THERAPY NEURO TREATMENT   Patient Name: [facility] MRN: [id number] DOB:Dec 05, 1960, 62 y.o., male Today's Date: 12/17/2022   PCP: Dr. Einar Crow REFERRING PROVIDER: Dr. Marygrace Drought  END OF SESSION:  PT End of Session - 12/17/22 1550     Visit Number 4    Number of Visits 24    Date for PT Re-Evaluation 02/22/23    Progress Note Due on Visit 10    PT Start Time 1601    PT Stop Time 1645    PT Time Calculation (min) 44 min    Equipment Utilized During Treatment Gait belt    Activity Tolerance Patient tolerated treatment well    Behavior During Therapy [facility] for tasks assessed/performed               No past medical history on file. Past Surgical History:  Procedure Laterality Date   COLONOSCOPY WITH PROPOFOL N/A 07/25/2020   Procedure: COLONOSCOPY WITH PROPOFOL;  Surgeon: Wyline Mood, MD;  Location: [facility];  Service: Gastroenterology;  Laterality: N/A;   HERNIA REPAIR     kidney removed     Patient Active Problem List   Diagnosis Date Noted   Primary hypertension 11/25/2021   Right foot pain 11/25/2021   Pulmonary nodule 09/29/2021   History of diverticulitis of colon 06/11/2020   Annual physical exam 05/15/2020   BPH without obstruction/lower urinary tract symptoms 05/15/2020   Primary osteoarthritis of right knee 05/15/2020   Irritable bowel syndrome with diarrhea 05/15/2020    ONSET DATE: 09/03/2022  REFERRING DIAG: I63.9 (ICD-10-CM) - Cerebral infarction, unspecified   THERAPY DIAG:  Muscle weakness (generalized)  Unsteadiness on feet  Abnormality of gait and mobility  Rationale for Evaluation and Treatment: Rehabilitation  SUBJECTIVE:                                                                                                                                                                                             SUBJECTIVE STATEMENT: Pt reports doing well today. Pt denies any recent falls/stumbles  since prior session. Pt denies any updates to medications or medical appointment since prior session. Pt reports good compliance with HEP when time permits.    Pt accompanied by: self  PERTINENT HISTORY: PER [facility] Course from 09/03/2022:  Eric Austin is a 62 y.o. male with a past medical history of HTN who was transferred to [facility] on 09/03/2022 with acute stroke affecting left pyramidal tract. Initially, PT/OT recommended discharge to [facility]. However, his condition improved and ultimately PT/OT/ST recommended outpatient treatment 3x weekly.   Per Dr. Judie Petit. 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 Some right hemiparesis is left, partial and using a cane but improving some  Right hemiparesis (CMS-HCC) Partial hemipareisis post left pontine stroke  Restless legs syndrome Gabapentin helps   PAIN:  Are you having pain? No  PRECAUTIONS: None  WEIGHT BEARING RESTRICTIONS: No  FALLS: Has patient fallen in last 6 months? 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   PATIENT GOALS:  Just to get a little stronger so I don't wear out so quick.  OBJECTIVE:   DIAGNOSTIC FINDINGS: Transthoracic echocardiogram, 09/03/22: LV normal size with upper normal wall thickness. LVEF visually estimated at 55-60%. Aortic valve is trileaflet with mildly thickened leaflets with normal excursion. RV normal in size with normal function.  CTA Head and Neck, 09/03/22: No findings to suggest large vessel occlusion, as clinically questioned. No evidence of  dissection, aneurysm or high-grade stenosis of the carotid or vertebral arteries.Small air locules in the right cavernous sinus and cervical epidural space likely representing iatrogenic venous gas embolism from IV access.  MRI Brain with and without contrast, 09/05/22: Acute to early subacute left pontine infarct. Chronic microvascular changes as characterized above.   COGNITION: Overall cognitive status: Within functional limits for tasks assessed   SENSATION: WFL  COORDINATION: Slight delay in heel to shin Right vs. Left  EDEMA:  None reported or observed in all extremities  MUSCLE TONE: RLE: Within functional limits RUE tone observed- minimal arm sway with gait   POSTURE: rounded shoulders and forward head  LOWER EXTREMITY ROM:      All Left and Right AROM = WNL with hips/knees/ankles   LOWER EXTREMITY MMT:    MMT Right Eval Left Eval  Hip flexion 4 5  Hip extension 4 5  Hip abduction 4 5  Hip adduction 5 5  Hip internal rotation 4 5  Hip external rotation 4 5  Knee flexion 4 5  Knee extension 4 5  Ankle dorsiflexion 4 5  Ankle plantarflexion    Ankle inversion    Ankle eversion    (Blank rows = not tested)  BED MOBILITY:  Not test- Patient reports independent  TRANSFERS: Assistive device utilized:  None   Sit to stand: Complete Independence Stand to sit: Complete Independence Chair to chair: Complete Independence Floor:  Not tested    STAIRS: Level of Assistance: SBA Stair Negotiation Technique: Alternating Pattern  with Bilateral Rails Number of Stairs: 4  Height of Stairs: 6"  Comments: No observed LOB- step through yet use of railing.  GAIT: Gait pattern: decreased arm swing- Right and decreased step length- Left Distance walked: 200+ feet Assistive device utilized:  None Level of assistance: CGA Comments:  No LOB or ataxia with normal walking- slow cadence/gait speed  FUNCTIONAL TESTS:  5 times sit to stand: 18.81 sec with  Timed up  and go (TUG): 12.67 sec without an AD 10 meter walk test: 0.68 m/s Functional gait assessment: 24/30  PATIENT SURVEYS:  FOTO 80  TODAY'S TREATMENT:  DATE: 12/17/2022  TE  Nustep LE only  1 min ea  Level 1 : level 4 x 2 round  Level 3 : level 5 x 2 rounds 50 spm   NMR  Paloff press hold with sidestepping with 7.5# resistance  3 rounds of 7 steps to R and then to L ( completed on each side)   On airex - paloff press 2 x 10 reps with 7.5# on matrix cable system   -chops RTB 2 x 10 reps facing each direction.  Activity Description: on bosu reaching for pods. Ant and lateral to pt Activity Setting:  The Blaze Pod Random setting was chosen to enhance cognitive processing and agility, providing an unpredictable environment to simulate real-world scenarios, and fostering quick reactions and adaptability.   Number of Pods:  6 Cycles/Sets:  2 Duration (Time or Hit Count):  1;30   Patient Stats  Hits:   27, 40 ( more hit sin same amount of time showing improved balance response.   On airex lateral lunge to bosu on R and dyna disc on L 2 x6 to ea side - changed wobble board positioned for sagittal plane rocking for 2 sets of 6 reps ea side, pt reports this felt similar to positions for weed eating.     PATIENT EDUCATION: Education details: Purpose of PT for LE strength and balance; Plan of care discussed- set goals with patient.  Person educated: Patient Education method: Explanation, Demonstration, Tactile cues, and Verbal cues Education comprehension: verbalized understanding, returned demonstration, verbal cues required, tactile cues required, and needs further education  HOME EXERCISE PROGRAM: Access Code: [email or web address] URL: [email or web address] Date: 11/30/2022 Prepared by: Maureen Ralphs  Exercises - Single Leg Stance  - 2-3 x weekly -  5 sets - up to 10+ sec hold - Tandem Stance  - 2-3 x weekly - 3 sets - 20-30 sec hold     GOALS: Goals reviewed with patient? Yes  SHORT TERM GOALS: Target date: 01/11/2023  Pt will be independent with HEP in order to improve strength and balance in order to decrease fall risk and improve function at home and work.   Baseline: EVAL- Patient has no formal HEP in place Goal status: INITIAL    LONG TERM GOALS: Target date: 02/22/2023  Pt will improve FOTO to target score of 85 to display perceived improvements in ability to complete ADL's.  Baseline: 80 Goal status: INITIAL  2.  Pt will increase by at least 0.13 m/s in order to demonstrate clinically significant improvement in community ambulation.   Baseline: EVAL= 0.68 m/s Goal status: INITIAL  3.  Pt will improve FGA by at least 3 points in order to demonstrate clinically significant improvement in balance and decreased risk for falls. Baseline: EVAL= 24/30 Goal status: INITIAL  4.  Pt will decrease 5TSTS by at least 3 seconds in order to demonstrate clinically significant improvement in LE strength. Baseline: EVAL= 18.81 sec without UE support Goal status: INITIAL   ASSESSMENT:  CLINICAL IMPRESSION:    Patient progresses with balance interventions this date with more functional activities focussed on return to work safely. Pt reports activities to be more difficult and similar to his work environment where he has to do lots of weed eating and reaching and standing on different surfaces and different grades.  Patient still doing very well with all activities and the patient continues to show this progress will consider reassessing goals at next few visits for potential for discharge.  Will also discussed  this with patient.Pt will continue to benefit from skilled physical therapy intervention to address impairments, improve QOL, and attain therapy goals.     OBJECTIVE IMPAIRMENTS: Abnormal gait, decreased balance,  decreased coordination, decreased endurance, decreased mobility, difficulty walking, and decreased strength.   ACTIVITY LIMITATIONS: lifting, bending, standing, squatting, and stairs  PARTICIPATION LIMITATIONS: cleaning, community activity, occupation, and yard work  PERSONAL FACTORS: 1 comorbidity: HTN  are also affecting patient's functional outcome.   REHAB POTENTIAL: Excellent  CLINICAL DECISION MAKING: Stable/uncomplicated  EVALUATION COMPLEXITY: Low  PLAN:  PT FREQUENCY: 1-2x/week  PT DURATION: 12 weeks  PLANNED INTERVENTIONS: Therapeutic exercises, Therapeutic activity, Neuromuscular re-education, Balance training, Gait training, Patient/Family education, Self Care, Joint mobilization, Joint manipulation, Stair training, Vestibular training, Canalith repositioning, DME instructions, Dry Needling, Electrical stimulation, Spinal manipulation, Spinal mobilization, Cryotherapy, Moist heat, Manual therapy, and Re-evaluation  PLAN FOR NEXT SESSION:   Instruct in LE strengthening and higher level balance next session.  Re-assess HEP and advance as appropriate. Add some care stabilization to his program combines with balance, assess his progression in returning to work.   Norman Herrlich PT ,DPT Physical Therapist- [place]  [facility]  3:52 PM 12/17/22

## 2022-12-18 ENCOUNTER — Encounter: Payer: Self-pay | Admitting: Physical Therapy

## 2022-12-21 ENCOUNTER — Ambulatory Visit: Payer: 59 | Admitting: Physical Therapy

## 2022-12-21 ENCOUNTER — Ambulatory Visit: Payer: 59

## 2022-12-21 DIAGNOSIS — R269 Unspecified abnormalities of gait and mobility: Secondary | ICD-10-CM

## 2022-12-21 DIAGNOSIS — R278 Other lack of coordination: Secondary | ICD-10-CM

## 2022-12-21 DIAGNOSIS — M6281 Muscle weakness (generalized): Secondary | ICD-10-CM | POA: Diagnosis not present

## 2022-12-21 DIAGNOSIS — R2681 Unsteadiness on feet: Secondary | ICD-10-CM

## 2022-12-21 NOTE — Therapy (Signed)
OUTPATIENT PHYSICAL THERAPY NEURO TREATMENT   Patient Name: [facility] MRN: [id number] DOB:1960/12/07, 62 y.o., male Today's Date: 12/21/2022   PCP: Dr. Einar Crow REFERRING PROVIDER: Dr. Marygrace Drought  END OF SESSION:  PT End of Session - 12/21/22 1023     Visit Number 5    Number of Visits 24    Date for PT Re-Evaluation 02/22/23    Progress Note Due on Visit 10    PT Start Time 1019    PT Stop Time 1100    PT Time Calculation (min) 41 min    Equipment Utilized During Treatment Gait belt    Activity Tolerance Patient tolerated treatment well    Behavior During Therapy [facility] for tasks assessed/performed               No past medical history on file. Past Surgical History:  Procedure Laterality Date   COLONOSCOPY WITH PROPOFOL N/A 07/25/2020   Procedure: COLONOSCOPY WITH PROPOFOL;  Surgeon: Wyline Mood, MD;  Location: [facility];  Service: Gastroenterology;  Laterality: N/A;   HERNIA REPAIR     kidney removed     Patient Active Problem List   Diagnosis Date Noted   Primary hypertension 11/25/2021   Right foot pain 11/25/2021   Pulmonary nodule 09/29/2021   History of diverticulitis of colon 06/11/2020   Annual physical exam 05/15/2020   BPH without obstruction/lower urinary tract symptoms 05/15/2020   Primary osteoarthritis of right knee 05/15/2020   Irritable bowel syndrome with diarrhea 05/15/2020    ONSET DATE: 09/03/2022  REFERRING DIAG: I63.9 (ICD-10-CM) - Cerebral infarction, unspecified   THERAPY DIAG:  Muscle weakness (generalized)  Other lack of coordination  Unsteadiness on feet  Abnormality of gait and mobility  Rationale for Evaluation and Treatment: Rehabilitation  SUBJECTIVE:                                                                                                                                                                                             SUBJECTIVE STATEMENT: Pt reports doing well today. Pt  denies any recent falls/stumbles since prior session. Pt denies any updates to medications or medical appointment since prior session. Pt reports good compliance with HEP when time permits.  Patient continues to report that hand weakness is the greatest limiting impairment following recent stroke.   Pt accompanied by: self  PERTINENT HISTORY: PER [facility] Course from 09/03/2022:  [facility] is a 62 y.o. male with a past medical history of HTN who was transferred to [facility] on 09/03/2022 with acute stroke affecting left pyramidal tract. Initially, PT/OT recommended discharge to [facility].  However, his condition improved and ultimately PT/OT/ST recommended outpatient treatment 3x weekly.   Per Dr. Judie Petit. 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 Some right hemiparesis is left, partial and using a cane but improving some  Right hemiparesis (CMS-HCC) Partial hemipareisis post left pontine stroke  Restless legs syndrome Gabapentin helps   PAIN:  Are you having pain? No  PRECAUTIONS: None  WEIGHT BEARING RESTRICTIONS: No  FALLS: Has patient fallen in last 6 months? 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   PATIENT GOALS:  Just to get a little stronger so I don't wear out so quick.  OBJECTIVE:   DIAGNOSTIC FINDINGS: Transthoracic echocardiogram, 09/03/22: LV normal size with upper normal wall thickness. LVEF visually estimated at 55-60%. Aortic valve is trileaflet with mildly thickened leaflets with normal excursion. RV normal in size with normal  function.  CTA Head and Neck, 09/03/22: No findings to suggest large vessel occlusion, as clinically questioned. No evidence of dissection, aneurysm or high-grade stenosis of the carotid or vertebral arteries.Small air locules in the right cavernous sinus and cervical epidural space likely representing iatrogenic venous gas embolism from IV access.  MRI Brain with and without contrast, 09/05/22: Acute to early subacute left pontine infarct. Chronic microvascular changes as characterized above.   COGNITION: Overall cognitive status: Within functional limits for tasks assessed   SENSATION: WFL  COORDINATION: Slight delay in heel to shin Right vs. Left  EDEMA:  None reported or observed in all extremities  MUSCLE TONE: RLE: Within functional limits RUE tone observed- minimal arm sway with gait   POSTURE: rounded shoulders and forward head  LOWER EXTREMITY ROM:      All Left and Right AROM = WNL with hips/knees/ankles   LOWER EXTREMITY MMT:    MMT Right Eval Left Eval  Hip flexion 4 5  Hip extension 4 5  Hip abduction 4 5  Hip adduction 5 5  Hip internal rotation 4 5  Hip external rotation 4 5  Knee flexion 4 5  Knee extension 4 5  Ankle dorsiflexion 4 5  Ankle plantarflexion    Ankle inversion    Ankle eversion    (Blank rows = not tested)  BED MOBILITY:  Not test- Patient reports independent  TRANSFERS: Assistive device utilized:  None   Sit to stand: Complete Independence Stand to sit: Complete Independence Chair to chair: Complete Independence Floor:  Not tested    STAIRS: Level of Assistance: SBA Stair Negotiation Technique: Alternating Pattern  with Bilateral Rails Number of Stairs: 4  Height of Stairs: 6"  Comments: No observed LOB- step through yet use of railing.  GAIT: Gait pattern: decreased arm swing- Right and decreased step length- Left Distance walked: 200+ feet Assistive device utilized:  None Level of assistance: CGA Comments:  No  LOB or ataxia with normal walking- slow cadence/gait speed  FUNCTIONAL TESTS:  5 times sit to stand: 18.81 sec with  Timed up and go (TUG): 12.67 sec without an AD 10 meter walk test: 0.68 m/s Functional gait assessment: 24/30  PATIENT SURVEYS:  FOTO 80  TODAY'S TREATMENT:  DATE: 12/21/2022  NuStep level 2 to 5 x 5 minutes, cues for steps per minute greater then 60 throughout variable resistance.  Dynamic balance training with dual task:   Sit<>stand with ball on cone, 2 x 8   Foot tap 8 inch step, while ho holding ball on cone x 10 bilateral lower extremity.  Standing on airex pad while balancing ball on tennis racket.  2 x 30 seconds bilateral Semitandem with 1 foot on Airex pad 1 foot on 8 inch step, ball bouncing ball on tennis racquet 2 x 30 seconds bilateral   Forward lunge with 1 UE supported.  X 6 bilateral Single-leg stance with the lower quarter reach.  Performed 2 x 8 bilateral  Cues for improved posture and stance knee position to allow use of hip strategy to prevent loss of balance.    PATIENT EDUCATION: Education details: Purpose of PT for LE strength and balance; Plan of care discussed- set goals with patient.  Person educated: Patient Education method: Explanation, Demonstration, Tactile cues, and Verbal cues Education comprehension: verbalized understanding, returned demonstration, verbal cues required, tactile cues required, and needs further education  HOME EXERCISE PROGRAM:  Access Code: [email or web address] URL: [email or web address] Date: 12/21/2022 Prepared by: Grier Rocher  Exercises - Lower Quarter Reach Combination  - 1 x daily - 5 x weekly - 3 sets - 5 reps - Lunge with Counter Support  - 1 x daily - 5 x weekly - 2 sets - 8 reps   Access Code: [email or web address] URL: [email or web address] Date:  11/30/2022 Prepared by: Maureen Ralphs  Exercises - Single Leg Stance  - 2-3 x weekly - 5 sets - up to 10+ sec hold - Tandem Stance  - 2-3 x weekly - 3 sets - 20-30 sec hold     GOALS: Goals reviewed with patient? Yes  SHORT TERM GOALS: Target date: 01/11/2023  Pt will be independent with HEP in order to improve strength and balance in order to decrease fall risk and improve function at home and work.   Baseline: EVAL- Patient has no formal HEP in place Goal status: INITIAL    LONG TERM GOALS: Target date: 02/22/2023  Pt will improve FOTO to target score of 85 to display perceived improvements in ability to complete ADL's.  Baseline: 80 Goal status: INITIAL  2.  Pt will increase by at least 0.13 m/s in order to demonstrate clinically significant improvement in community ambulation.   Baseline: EVAL= 0.68 m/s Goal status: INITIAL  3.  Pt will improve FGA by at least 3 points in order to demonstrate clinically significant improvement in balance and decreased risk for falls. Baseline: EVAL= 24/30 Goal status: INITIAL  4.  Pt will decrease 5TSTS by at least 3 seconds in order to demonstrate clinically significant improvement in LE strength. Baseline: EVAL= 18.81 sec without UE support Goal status: INITIAL   ASSESSMENT:  CLINICAL IMPRESSION:    Patient progresses with balance interventions this date with more functional activities focussed on return to work safely.  PT treatment focused on dual task requiring dynamic standing balance and fine motor control with use of the right upper extremity.  PT provided HEP for dynamic balance training and strengthening and bilateral lower extremity to allow improved safety with highly physical work tasks.  Pt will continue to benefit from skilled physical therapy intervention to address impairments, improve QOL, and attain therapy goals.     OBJECTIVE IMPAIRMENTS: Abnormal gait, decreased balance, decreased coordination,  decreased endurance, decreased mobility, difficulty walking, and  decreased strength.   ACTIVITY LIMITATIONS: lifting, bending, standing, squatting, and stairs  PARTICIPATION LIMITATIONS: cleaning, community activity, occupation, and yard work  PERSONAL FACTORS: 1 comorbidity: HTN  are also affecting patient's functional outcome.   REHAB POTENTIAL: Excellent  CLINICAL DECISION MAKING: Stable/uncomplicated  EVALUATION COMPLEXITY: Low  PLAN:  PT FREQUENCY: 1-2x/week  PT DURATION: 12 weeks  PLANNED INTERVENTIONS: Therapeutic exercises, Therapeutic activity, Neuromuscular re-education, Balance training, Gait training, Patient/Family education, Self Care, Joint mobilization, Joint manipulation, Stair training, Vestibular training, Canalith repositioning, DME instructions, Dry Needling, Electrical stimulation, Spinal manipulation, Spinal mobilization, Cryotherapy, Moist heat, Manual therapy, and Re-evaluation  PLAN FOR NEXT SESSION:   Instruct in LE strengthening and higher level balance next session.  Re-assess HEP and advance as appropriate   Golden Pop PT ,DPT Physical Therapist- [place]  [facility]  1:09 PM 12/21/22       Note: Portions of this document were prepared using Dragon voice recognition software and although reviewed may contain unintentional dictation errors in syntax, grammar, or spelling.

## 2022-12-22 NOTE — Therapy (Signed)
Occupational Therapy Neuro Treatment Note  Patient Name: Eric Austin MRN: [id number] DOB:[birth year], 61 y.o., male Today's Date: 12/22/2022  PCP: Dr. Einar Crow REFERRING PROVIDER: Dr. Almon Hercules  END OF SESSION:  OT End of Session - 12/21/22 1130     Visit Number 14    Number of Visits 24    Date for OT Re-Evaluation 12/24/22    Progress Note Due on Visit 10    OT Start Time 1100    OT Stop Time 1145    OT Time Calculation (min) 45 min    Equipment Utilized During Treatment none    Activity Tolerance Patient tolerated treatment well    Behavior During Therapy [facility] for tasks assessed/performed            No past medical history on file. Past Surgical History:  Procedure Laterality Date   COLONOSCOPY WITH PROPOFOL N/A 07/25/2020   Procedure: COLONOSCOPY WITH PROPOFOL;  Surgeon: Wyline Mood, MD;  Location: [facility] ENDOSCOPY;  Service: Gastroenterology;  Laterality: N/A;   HERNIA REPAIR     kidney removed     Patient Active Problem List   Diagnosis Date Noted   Primary hypertension 11/25/2021   Right foot pain 11/25/2021   Pulmonary nodule 09/29/2021   History of diverticulitis of colon 06/11/2020   Annual physical exam 05/15/2020   BPH without obstruction/lower urinary tract symptoms 05/15/2020   Primary osteoarthritis of right knee 05/15/2020   Irritable bowel syndrome with diarrhea 05/15/2020   ONSET DATE: Feb. 2024  REFERRING DIAG: I63.9 (ICD-10-CM) - Cerebral infarction, unspecified   THERAPY DIAG:  Muscle weakness (generalized)  Other lack of coordination  Rationale for Evaluation and Treatment: Rehabilitation  SUBJECTIVE:  SUBJECTIVE STATEMENT: Pt reports doing well today. Pt accompanied by: significant other  PERTINENT HISTORY:  Per [facility] chart, Eric Austin is a 62 y.o. male with a past medical history of HTN who was transferred to [facility] on 09/03/2022 with acute stroke affecting left pyramidal tract. Initially, PT/OT recommended discharge  to [facility]. However, his condition improved and ultimately PT/OT/ST recommended outpatient treatment 3x weekly.   PRECAUTIONS: None  WEIGHT BEARING RESTRICTIONS: No  PAIN:  Are you having pain? no  FALLS: Has patient fallen in last 6 months? No  LIVING ENVIRONMENT: Lives with: lives with their spouse Lives in: 1 level home Stairs: Yes: External: 4 steps; on right going up Has following equipment at home:  was using a 4 pronged cane but no longer having to use it, built in shower seat   PLOF: Independent, full time landscaping  PATIENT GOALS: Get my right hand stronger and be able to write my name and eat with my right hand. OBJECTIVE:   HAND DOMINANCE: Right  ADLs: Overall ADLs: extra time; difficulty using the R hand Transfers/ambulation related to ADLs: indep with dressing/bathing Eating: just started eating with the R hand but with difficulty Grooming: pt tries a little with the R, but has to use L mostly UB Dressing: difficulty with clothing fasteners LB Dressing: difficulty with clothing fasteners Toileting: indep Bathing: indep Tub Shower transfers: indep Equipment:  see above  IADLs: Shopping: supv Light housekeeping: with effort Meal Prep: cooking on the [employer] mobility: ambulatory without AD Medication management: supv, occasional reminders Financial management: indep Handwriting: 100% legible and Increased time, pt reports almost to his baseline with handwriting.   MOBILITY STATUS:  ambulatory without AD  POSTURE COMMENTS:  No Significant postural limitations Sitting balance: Moves/returns truncal midpoint >2 inches in all planes  ACTIVITY TOLERANCE: Activity tolerance: WFL for assessment activities  FUNCTIONAL OUTCOME MEASURES: FOTO: 65; predicted 73  UPPER EXTREMITY ROM:  BUEs [facility]  11/30/2022:   Digit flexion to the [facility]:  2nd:4 cm (0) 3rd: 3.5 cm (0) 4th: 3cm (0) 5th: 2.5cm (0)  UPPER EXTREMITY MMT:     MMT Right eval Right   11/30/2022 Left eval  Shoulder flexion 4- 4+ 5  Shoulder abduction 4- 4 5  Shoulder adduction     Shoulder extension     Shoulder internal rotation     Shoulder external rotation     Middle trapezius     Lower trapezius     Elbow flexion 4+ 5 5  Elbow extension 4+ 5 5  Wrist flexion 4+ 5 5  Wrist extension 4+ 5 5  Wrist ulnar deviation     Wrist radial deviation     Wrist pronation  5   Wrist supination  5   (Blank rows = not tested)  HAND FUNCTION: Grip strength: Right: 37 lbs; Left: 64 lbs, Lateral pinch: Right: 11 lbs, Left: 18 lbs, and 3 point pinch: Right: 13 lbs, Left: 20 lbs  11/30/2022 Grip strength: Right: 25 lbs; Left: 64 lbs, Lateral pinch: Right: 14 lbs, Left: 18 lbs, and 3 point pinch: Right: 11 lbs, Left: 20 lbs  12/17/22: Grip strength: R 37 lbs; Lateral pinch: R: 16 lbs; 3 point pinch: R: 13 lbs  COORDINATION: Finger Nose Finger test: good, slightly slower than L 9 Hole Peg test: Right: 35 sec; Left: 25 sec  11/30/2022  9 Hole Peg test: Right: 33 sec; Left: 25 sec  SENSATION: WFL Light touch: WFL  EDEMA: none  MUSCLE TONE: RUE: Within functional limits  COGNITION: Overall cognitive status: mood lability; pt intermittently tearful x2 during eval  VISION: Subjective report: Pt reports that he wears hard contacts and only put a contact in the L eye today d/t difficulty.  Pt reports denies any changes in vision since CVA.  VISION ASSESSMENT: Ocular ROM: WFL Tracking/Visual pursuits: Able to track stimulus in all quads without difficulty Saccades: WFL Visual Fields: no apparent deficits  PERCEPTION: WFL  PRAXIS: Impaired: Motor planning  OBSERVATIONS: Pt making good efforts to engage the R hand for all assessment activities.   TODAY'S TREATMENT:  Therapeutic Exercise: Performed passive stretching for R wrist extension and R hand MP, PIP, and DIP flexion/ext, working to increase flexibility for making composite fist.  Performed digit blocking  exercises for R IF and LF DIP and PIP, also working to increase composite fist for grasping and storing ADL supplies.  Pt able to return demo with min tactile cues.  Instructed pt in R wrist strengthening exercises, including wrist flex, ext, RD, and forearm pron/sup using a 3# dumbbell.  Handout issued and reviewed with pt; able to return demo with min vc.  Encouraged completion at home for improving R wrist stability.  Facilitated hand strengthening with use of hand gripper set at 23.4# for 1 trial, and reduced to 17.9# for a 2nd trial to remove jumbo pegs from pegboard using R hand for 2 trials total.  Pt required set up of gripper in hand at base of gripper to rest the R IF in groove for better engagement of this finger into each squeeze.  Facilitated pinch strengthening with use of therapy resistant clothespins with moderate to strong resistance colors only (green, blue, and black) to target lateral for improving efficiency to open pt's pocket knife.  Pt completed 2 trials of  colors noted above, clipping pins onto a horizontal dowel.    PATIENT EDUCATION: Education details: Digit blocking exercises for increasing composite fist. Person educated: Patient Education method: Explanation and Verbal cues, demo Education comprehension: able to return demo  HOME EXERCISE PROGRAM:  Left hand fisting, tendon glide, and intrinsic stretches, digit blocking exercises  GOALS: Goals reviewed with patient? Yes  SHORT TERM GOALS: Target date: 01/11/2023  Pt will be indep to perform HEP for improving strength and coordination throughout the RUE. Baseline: 11/30/2022: Independent. Eval: initiate putty exercises at eval, further training needed Goal status: Ongoing  LONG TERM GOALS: Target date: 12/24/22 (12 weeks)  Pt will increase FOTO score to 73 or better to indicate improvement in self perceived functional use of the R arm with daily tasks. Baseline: 11/30/2022:73 TR score: 73 Eval: 65 Goal status:  Achieved  2.  Pt will increase R grip strength by 20 or more lbs in order to improve ability to hold and carry ADL supplies.   Baseline: 11/30/2022: R: 25# Eval: R 37 (L 64) Goal status: Ongoing  3.  Pt will improve FMC/dexterity skills in R  hand to be able to pick up coins or pills from a table with greater ease.  Baseline: 11/30/2022: R: 33 sec. Eval: with difficulty/increased time (9 hole peg test R 35 sec, L 25 sec) Goal status: INITIAL  4.  Pt will use the R hand to eat with good accuracy and adapted utensils as needed. Baseline: 11/30/2022: Pt. Is progressing with self feeding, handling utensils. Eval: pt just started eating with the R hand, but with effort and some mess Goal status: INITIAL  5.  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  Baseline: 11/30/2022: Right shoulder flexion 4+/, abduction 4/5, elbow flexion: 5/5 extension, forearm supination 5/5, pronation 5/5, wrist extension:5/5 R shoulder 4-, elbow and wrist 4+ (See LTG #2 for R hand strength); LUE 5/5 throughout  Goal status: INITIAL  ASSESSMENT: CLINICAL IMPRESSION: Good tolerance to all therapeutic exercises this date.  Reduced sets and resistance on the hand gripper this date d/t pt reporting increased fatigue in the R arm following PT visit prior to OT.  Pt stated that he worked on holding a Economist in PT while working on his balance, so R hand was more fatigued to start OT session.  Progressed HEP this date with R wrist strengthening and digit blocking exercises.  Pt tolerated a 3# dumbbell for R wrist and forearm strengthening, and reviewed wrist and digit passive stretching d/t ongoing reports of stiffness distally in the RUE.  Good return demo with min vc; handout issued and will continue to review.  Pt will continue to benefit from skilled OT to address RUE weakness and coordination deficits in order to maximize independent and use of  the R dominant arm for daily tasks, improve function for return to work, and improve QOL.  PERFORMANCE DEFICITS: in functional skills including ADLs, IADLs, coordination, dexterity, strength, Fine motor control, Gross motor control, decreased knowledge of use of DME, and UE functional use, cognitive skills including emotional, and psychosocial skills including coping strategies and routines and behaviors.   IMPAIRMENTS: are limiting patient from ADLs, IADLs, work, and leisure.   CO-MORBIDITIES: has no other co-morbidities that affects occupational performance. Patient will benefit from skilled OT to address above impairments and improve overall function.  MODIFICATION OR ASSISTANCE TO COMPLETE EVALUATION: No modification of tasks or assist necessary  to complete an evaluation.  OT OCCUPATIONAL PROFILE AND HISTORY: Problem focused assessment: Including review of records relating to presenting problem.  CLINICAL DECISION MAKING: Moderate - several treatment options, min-mod task modification necessary  REHAB POTENTIAL: Good  EVALUATION COMPLEXITY: Moderate    PLAN:  OT FREQUENCY: 2x/week  OT DURATION: 12 weeks  PLANNED INTERVENTIONS: self care/ADL training, therapeutic exercise, therapeutic activity, neuromuscular re-education, manual therapy, passive range of motion, moist heat, cryotherapy, contrast bath, patient/family education, coping strategies training, and DME and/or AE instructions  RECOMMENDED OTHER SERVICES: None at this time  CONSULTED AND AGREED WITH PLAN OF CARE: Patient  PLAN FOR NEXT SESSION: HEP review, neuro re-ed and therapeutic exercises  Danelle Earthly, MS, OTR/L

## 2022-12-23 NOTE — Therapy (Signed)
OUTPATIENT PHYSICAL THERAPY NEURO TREATMENT   Patient Name: Eric Austin MRN: [id number] DOB:Jul 10, 1961, 62 y.o., male Today's Date: 12/27/2022   PCP: Dr. Einar Crow REFERRING PROVIDER: Dr. Marygrace Drought  END OF SESSION:  PT End of Session - 12/27/22 2135     Visit Number 6    Number of Visits 24    Date for PT Re-Evaluation 02/22/23    Progress Note Due on Visit 10    PT Start Time 1101    PT Stop Time 1143    PT Time Calculation (min) 42 min    Equipment Utilized During Treatment Gait belt    Activity Tolerance Patient tolerated treatment well    Behavior During Therapy [facility] for tasks assessed/performed                History reviewed. No pertinent past medical history. Past Surgical History:  Procedure Laterality Date   COLONOSCOPY WITH PROPOFOL N/A 07/25/2020   Procedure: COLONOSCOPY WITH PROPOFOL;  Surgeon: Wyline Mood, MD;  Location: [facility] ENDOSCOPY;  Service: Gastroenterology;  Laterality: N/A;   HERNIA REPAIR     kidney removed     Patient Active Problem List   Diagnosis Date Noted   Primary hypertension 11/25/2021   Right foot pain 11/25/2021   Pulmonary nodule 09/29/2021   History of diverticulitis of colon 06/11/2020   Annual physical exam 05/15/2020   BPH without obstruction/lower urinary tract symptoms 05/15/2020   Primary osteoarthritis of right knee 05/15/2020   Irritable bowel syndrome with diarrhea 05/15/2020    ONSET DATE: 09/03/2022  REFERRING DIAG: I63.9 (ICD-10-CM) - Cerebral infarction, unspecified   THERAPY DIAG:  Muscle weakness (generalized)  Other lack of coordination  Unsteadiness on feet  Abnormality of gait and mobility  Rationale for Evaluation and Treatment: Rehabilitation  SUBJECTIVE:                                                                                                                                                                                             SUBJECTIVE STATEMENT: Patient  reports doing well - states had some difficulty at work- 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   Pt accompanied by: self  PERTINENT HISTORY: PER [facility] Course from 09/03/2022:  Eric Austin is a 62 y.o. male with a past medical history of HTN who was transferred to [facility] on 09/03/2022 with acute stroke affecting left pyramidal tract. Initially, PT/OT recommended discharge to [facility]. However, his condition improved and ultimately PT/OT/ST recommended outpatient treatment 3x weekly.   Per Dr. Judie Petit. 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 Some right hemiparesis is left, partial and using a cane but improving some  Right hemiparesis (CMS-HCC) Partial hemipareisis post left pontine stroke  Restless legs syndrome Gabapentin helps   PAIN:  Are you having pain? No  PRECAUTIONS: None  WEIGHT BEARING RESTRICTIONS: No  FALLS: Has patient fallen in last 6 months? 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   PATIENT GOALS:  Just to get a little stronger so I don't wear out so quick.  OBJECTIVE:   DIAGNOSTIC FINDINGS: Transthoracic echocardiogram, 09/03/22: LV normal size with upper normal wall thickness. LVEF visually estimated at 55-60%. Aortic valve is trileaflet with mildly thickened leaflets with normal excursion. RV normal in size with normal function.  CTA Head and Neck, 09/03/22: No findings to suggest large vessel occlusion, as clinically questioned. No evidence of dissection, aneurysm or  high-grade stenosis of the carotid or vertebral arteries.Small air locules in the right cavernous sinus and cervical epidural space likely representing iatrogenic venous gas embolism from IV access.  MRI Brain with and without contrast, 09/05/22: Acute to early subacute left pontine infarct. Chronic microvascular changes as characterized above.   COGNITION: Overall cognitive status: Within functional limits for tasks assessed   SENSATION: WFL  COORDINATION: Slight delay in heel to shin Right vs. Left  EDEMA:  None reported or observed in all extremities  MUSCLE TONE: RLE: Within functional limits RUE tone observed- minimal arm sway with gait   POSTURE: rounded shoulders and forward head  LOWER EXTREMITY ROM:      All Left and Right AROM = WNL with hips/knees/ankles   LOWER EXTREMITY MMT:    MMT Right Eval Left Eval  Hip flexion 4 5  Hip extension 4 5  Hip abduction 4 5  Hip adduction 5 5  Hip internal rotation 4 5  Hip external rotation 4 5  Knee flexion 4 5  Knee extension 4 5  Ankle dorsiflexion 4 5  Ankle plantarflexion    Ankle inversion    Ankle eversion    (Blank rows = not tested)  BED MOBILITY:  Not test- Patient reports independent  TRANSFERS: Assistive device utilized:  None   Sit to stand: Complete Independence Stand to sit: Complete Independence Chair to chair: Complete Independence Floor:  Not tested    STAIRS: Level of Assistance: SBA Stair Negotiation Technique: Alternating Pattern  with Bilateral Rails Number of Stairs: 4  Height of Stairs: 6"  Comments: No observed LOB- step through yet use of railing.  GAIT: Gait pattern: decreased arm swing- Right and decreased step length- Left Distance walked: 200+ feet Assistive device utilized:  None Level of assistance: CGA Comments:  No LOB or ataxia with normal walking- slow cadence/gait speed  FUNCTIONAL TESTS:  5 times sit to stand: 18.81 sec with  Timed up and go (TUG): 12.67 sec  without an AD 10 meter walk test: 0.68 m/s Functional gait assessment: 24/30  PATIENT SURVEYS:  FOTO 80  TODAY'S TREATMENT:  DATE: 12/24/2022  NMR: in // bars with CGA and use of gait belt:  Simulated truck bed activities that patient encountered this week at week: in // bars  - positioned 3 airex pad in bars and patient ambulated - large step from one to next to the next and back x 8.   -gentle bouncing on airex pad x 5 then step to next one and repeat x 6 total times. Min unsteadiness initially yet improved with practice.   -Side stepping from one to next to next down and back x6 (no LOB or unsteadiness)   - forward lunge from one airex pad to next x 12 reps each LE without UE support    THEREX:  -Squats with 15# KB in each Arm and standing on airex pad- 2 sets of 12 reps.   - Farmers carry 15 lb. KB each arm x 35 feet x 4 (1-2 episodes of LOB after standing up from unloading weights- posterior LOB with CGA for recovery)          PATIENT EDUCATION: Education details: Purpose of PT for LE strength and balance; Plan of care discussed- set goals with patient.  Person educated: Patient Education method: Explanation, Demonstration, Tactile cues, and Verbal cues Education comprehension: verbalized understanding, returned demonstration, verbal cues required, tactile cues required, and needs further education  HOME EXERCISE PROGRAM:  Access Code: [email or web address] URL: [email or web address] Date: 12/21/2022 Prepared by: Grier Rocher  Exercises - Lower Quarter Reach Combination  - 1 x daily - 5 x weekly - 3 sets - 5 reps - Lunge with Counter Support  - 1 x daily - 5 x weekly - 2 sets - 8 reps   Access Code: [email or web address] URL: [email or web address] Date: 11/30/2022 Prepared by: Maureen Ralphs  Exercises - Single Leg Stance  -  2-3 x weekly - 5 sets - up to 10+ sec hold - Tandem Stance  - 2-3 x weekly - 3 sets - 20-30 sec hold     GOALS: Goals reviewed with patient? Yes  SHORT TERM GOALS: Target date: 01/11/2023  Pt will be independent with HEP in order to improve strength and balance in order to decrease fall risk and improve function at home and work.   Baseline: EVAL- Patient has no formal HEP in place Goal status: INITIAL    LONG TERM GOALS: Target date: 02/22/2023  Pt will improve FOTO to target score of 85 to display perceived improvements in ability to complete ADL's.  Baseline: 80 Goal status: INITIAL  2.  Pt will increase by at least 0.13 m/s in order to demonstrate clinically significant improvement in community ambulation.   Baseline: EVAL= 0.68 m/s Goal status: INITIAL  3.  Pt will improve FGA by at least 3 points in order to demonstrate clinically significant improvement in balance and decreased risk for falls. Baseline: EVAL= 24/30 Goal status: INITIAL  4.  Pt will decrease 5TSTS by at least 3 seconds in order to demonstrate clinically significant improvement in LE strength. Baseline: EVAL= 18.81 sec without UE support Goal status: INITIAL   ASSESSMENT:  CLINICAL IMPRESSION:    Patient presents with good overall balance with today's activities- only a couple of LOB with turning or sit to stand lifting KB's. He is overall progressing and able to perform higher level activities without any significant LOB. Anticipate discharge next 1-2- need to reassess goals.   Pt will continue to benefit from skilled physical therapy intervention to address impairments, improve QOL, and attain therapy goals.  OBJECTIVE IMPAIRMENTS: Abnormal gait, decreased balance, decreased coordination, decreased endurance, decreased mobility, difficulty walking, and decreased strength.   ACTIVITY LIMITATIONS: lifting, bending, standing, squatting, and stairs  PARTICIPATION LIMITATIONS: cleaning,  community activity, occupation, and yard work  PERSONAL FACTORS: 1 comorbidity: HTN  are also affecting patient's functional outcome.   REHAB POTENTIAL: Excellent  CLINICAL DECISION MAKING: Stable/uncomplicated  EVALUATION COMPLEXITY: Low  PLAN:  PT FREQUENCY: 1-2x/week  PT DURATION: 12 weeks  PLANNED INTERVENTIONS: Therapeutic exercises, Therapeutic activity, Neuromuscular re-education, Balance training, Gait training, Patient/Family education, Self Care, Joint mobilization, Joint manipulation, Stair training, Vestibular training, Canalith repositioning, DME instructions, Dry Needling, Electrical stimulation, Spinal manipulation, Spinal mobilization, Cryotherapy, Moist heat, Manual therapy, and Re-evaluation  PLAN FOR NEXT SESSION:   Instruct in LE strengthening and higher level balance next session.  Re-assess  goals and HEP and discharge if appropriate.    Lenda Kelp PT Physical Therapist- [place]  [facility]  9:35 PM 12/27/22       Note: Portions of this document were prepared using Dragon voice recognition software and although reviewed may contain unintentional dictation errors in syntax, grammar, or spelling.

## 2022-12-24 ENCOUNTER — Ambulatory Visit: Payer: 59

## 2022-12-24 DIAGNOSIS — M6281 Muscle weakness (generalized): Secondary | ICD-10-CM | POA: Diagnosis not present

## 2022-12-24 DIAGNOSIS — R278 Other lack of coordination: Secondary | ICD-10-CM

## 2022-12-24 DIAGNOSIS — R2681 Unsteadiness on feet: Secondary | ICD-10-CM

## 2022-12-24 DIAGNOSIS — R269 Unspecified abnormalities of gait and mobility: Secondary | ICD-10-CM

## 2022-12-24 NOTE — Therapy (Signed)
Occupational Therapy Neuro Discharge Note  Patient Name: Eric Austin MRN: [id number] DOB:[birth year], 62 y.o., male Today's Date: 12/25/2022  PCP: Dr. Einar Crow REFERRING PROVIDER: Dr. Almon Hercules  END OF SESSION:  OT End of Session - 12/25/22 1823     Visit Number 15    Number of Visits 24    Date for OT Re-Evaluation 12/24/22    Progress Note Due on Visit 10    OT Start Time 1145    OT Stop Time 1215    OT Time Calculation (min) 30 min    Equipment Utilized During Treatment none    Activity Tolerance Patient tolerated treatment well    Behavior During Therapy [facility] for tasks assessed/performed            No past medical history on file. Past Surgical History:  Procedure Laterality Date   COLONOSCOPY WITH PROPOFOL N/A 07/25/2020   Procedure: COLONOSCOPY WITH PROPOFOL;  Surgeon: Wyline Mood, MD;  Location: [facility] ENDOSCOPY;  Service: Gastroenterology;  Laterality: N/A;   HERNIA REPAIR     kidney removed     Patient Active Problem List   Diagnosis Date Noted   Primary hypertension 11/25/2021   Right foot pain 11/25/2021   Pulmonary nodule 09/29/2021   History of diverticulitis of colon 06/11/2020   Annual physical exam 05/15/2020   BPH without obstruction/lower urinary tract symptoms 05/15/2020   Primary osteoarthritis of right knee 05/15/2020   Irritable bowel syndrome with diarrhea 05/15/2020   ONSET DATE: Feb. 2024  REFERRING DIAG: I63.9 (ICD-10-CM) - Cerebral infarction, unspecified   THERAPY DIAG:  Muscle weakness (generalized)  Other lack of coordination  Rationale for Evaluation and Treatment: Rehabilitation  SUBJECTIVE:  SUBJECTIVE STATEMENT: Pt reports readiness to d/c this visit as he feels confident with his HEP and is uncertain of his schedule in the near future as pt's spouse has a visit with a surgeon on Monday r/t a new ca dx.  Pt accompanied by: significant other  PERTINENT HISTORY:  Per [facility] chart, Eric Austin is a 62 y.o.  male with a past medical history of HTN who was transferred to [facility] on 09/03/2022 with acute stroke affecting left pyramidal tract. Initially, PT/OT recommended discharge to [facility]. However, his condition improved and ultimately PT/OT/ST recommended outpatient treatment 3x weekly.   PRECAUTIONS: None  WEIGHT BEARING RESTRICTIONS: No  PAIN:  Are you having pain? no  FALLS: Has patient fallen in last 6 months? No  LIVING ENVIRONMENT: Lives with: lives with their spouse Lives in: 1 level home Stairs: Yes: External: 4 steps; on right going up Has following equipment at home:  was using a 4 pronged cane but no longer having to use it, built in shower seat   PLOF: Independent, full time landscaping  PATIENT GOALS: Get my right hand stronger and be able to write my name and eat with my right hand. OBJECTIVE:   HAND DOMINANCE: Right  ADLs: Overall ADLs: extra time; difficulty using the R hand Transfers/ambulation related to ADLs: indep with dressing/bathing Eating: just started eating with the R hand but with difficulty Grooming: pt tries a little with the R, but has to use L mostly UB Dressing: difficulty with clothing fasteners LB Dressing: difficulty with clothing fasteners Toileting: indep Bathing: indep Tub Shower transfers: indep Equipment:  see above  IADLs: Shopping: supv Light housekeeping: with effort Meal Prep: cooking on the [employer] mobility: ambulatory without AD Medication management: supv, occasional reminders Financial management: indep Handwriting: 100% legible and Increased  time, pt reports almost to his baseline with handwriting.   MOBILITY STATUS:  ambulatory without AD  POSTURE COMMENTS:  No Significant postural limitations Sitting balance: Moves/returns truncal midpoint >2 inches in all planes  ACTIVITY TOLERANCE: Activity tolerance: WFL for assessment activities  FUNCTIONAL OUTCOME MEASURES: FOTO: 65; predicted 73 12/24/22: 85   UPPER  EXTREMITY ROM:  BUEs [facility]  11/30/2022:   Digit flexion to the [facility]:  2nd:4 cm (0) 3rd: 3.5 cm (0) 4th: 3cm (0) 5th: 2.5cm (0)  12/24/22: Active R wrist ext 44, L 46, flexion R 40, L 44   UPPER EXTREMITY MMT:     MMT Right eval Right  11/30/2022 Right 12/24/22 Left eval  Shoulder flexion 4- 4+ 5 5  Shoulder abduction 4- 4 5 5   Shoulder adduction      Shoulder extension      Shoulder internal rotation      Shoulder external rotation      Middle trapezius      Lower trapezius      Elbow flexion 4+ 5 5 5   Elbow extension 4+ 5 5 5   Wrist flexion 4+ 5 5 5   Wrist extension 4+ 5 5 5   Wrist ulnar deviation      Wrist radial deviation      Wrist pronation  5 5   Wrist supination  5 5   (Blank rows = not tested)  HAND FUNCTION: Grip strength: Right: 37 lbs; Left: 64 lbs, Lateral pinch: Right: 11 lbs, Left: 18 lbs, and 3 point pinch: Right: 13 lbs, Left: 20 lbs 11/30/2022 Grip strength: Right: 25 lbs; Left: 64 lbs, Lateral pinch: Right: 14 lbs, Left: 18 lbs, and 3 point pinch: Right: 11 lbs, Left: 20 lbs 12/17/22: Grip strength: R 37 lbs; Lateral pinch: R: 16 lbs; 3 point pinch: R: 13 lbs 12/24/22: Grip strength: R 34 lbs on 2nd notch of grip dynamometer (41 lbs on 3rd notch of dynamometer; easier to grip d/t stiffness in R IF); Lateral pinch: R: 17 lbs; 3 point pinch: R: 12 lbs  COORDINATION: Finger Nose Finger test: good, slightly slower than L 9 Hole Peg test: Right: 35 sec; Left: 25 sec 11/30/2022 9 Hole Peg test: Right: 33 sec; Left: 25 sec 12/24/22: 9 hole Peg test: Right: 29 sec   SENSATION: WFL Light touch: WFL  EDEMA: none  MUSCLE TONE: RUE: Within functional limits  COGNITION: Overall cognitive status: mood lability; pt intermittently tearful x2 during eval  VISION: Subjective report: Pt reports that he wears hard contacts and only put a contact in the L eye today d/t difficulty.  Pt reports denies any changes in vision since CVA.  VISION ASSESSMENT: Ocular ROM:  WFL Tracking/Visual pursuits: Able to track stimulus in all quads without difficulty Saccades: WFL Visual Fields: no apparent deficits  PERCEPTION: WFL  PRAXIS: Impaired: Motor planning  OBSERVATIONS: Pt making good efforts to engage the R hand for all assessment activities.   TODAY'S TREATMENT:  Therapeutic Exercise: Objective measures taken and goals updated for d/c.  HEP reviewed; pt verbalizes good compliance and demos understanding of all strengthening, coordination, and ROM exercises for the R wrist and hand.  PATIENT EDUCATION: Education details: HEP review Person educated: Patient Education method: explanation, Lobbyist comprehension: able to return demo  HOME EXERCISE PROGRAM:  Left hand fisting, tendon glide, and intrinsic stretches, digit blocking exercises, wrist strengthening with 3# dumbbell  GOALS: Goals reviewed with patient? Yes  SHORT TERM GOALS: Target date: 01/11/2023  Pt will be  indep to perform HEP for improving strength and coordination throughout the RUE. Baseline: Eval: initiate putty exercises at eval, further training needed; 11/30/2022: Independent; 12/24/22: indep, including addition of dumbbell exercises for R wrist strengthening.  Goal status: achieved  LONG TERM GOALS: Target date: 12/24/22 (12 weeks)  Pt will increase FOTO score to 73 or better to indicate improvement in self perceived functional use of the R arm with daily tasks. Baseline: Eval: 65; 11/30/2022:73 TR score: 73; 12/24/22: 85 Goal status: achieved  2.  Pt will increase R grip strength by 20 or more lbs in order to improve ability to hold and carry ADL supplies.   Baseline: Eval: R 37 (L 64); 11/30/2022: R: 25#; 12/24/22: 41 lbs (3rd notch) Goal status: not met/improving  3.  Pt will improve FMC/dexterity skills in R  hand to be able to pick up coins or pills from a table with greater ease.  Baseline: Eval: with difficulty/increased time (9 hole peg test R 35 sec, L 25 sec);  11/30/2022: R: 33 sec; 12/24/22: R 29 sec and able to pick up coins or pills from a table top without difficulty. Goal status: achieved  4.  Pt will use the R hand to eat with good accuracy and adapted utensils as needed. Baseline: Eval: pt just started eating with the R hand, but with effort and some mess; 11/30/2022: Pt. Is progressing with self feeding, handling utensils; 12/24/22: able to cut meat, still some effort but less mess using standard eating utensils Goal status: achieved  5.  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  Baseline: Eval: R shoulder 4-, elbow and wrist 4+ (See LTG #2 for R hand strength); LUE 5/5 throughout; 11/30/2022: Right shoulder flexion 4+/, abduction 4/5, elbow flexion: 5/5 extension, forearm supination 5/5, pronation 5/5, wrist extension: 5/5; 12/24/22: RUE 5/5 throughout  Goal status: achieved  ASSESSMENT: CLINICAL IMPRESSION: Pt seen for OT d/c this date.  Pt has been seen by OT for 15 visits to address functional decline related to RUE weakness and coordination deficits post CVA.  At d/c, FOTO score has improved from 65 at eval to 85 at d/c.  R hand coordination skills show good improvement (see 9 hole), and pt is now eating with the R dominant hand with less mess, fairly good accuracy, using standard utensils.  RUE strength is now 5/5 throughout shoulder, elbow, and wrist, with some residual weakness remaining in the R hand.  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  Pt is indep with HEP and has been encouraged to continue to focus on R grip strengthening to further ease operation of work equipment, though he reports he can now pull the cord to start a weed eater, he is able to [employer], and is able to use a trimmer overhead to cut limbs.  Pt  appropriate for d/c this date.      PERFORMANCE DEFICITS: in functional skills including ADLs, IADLs, coordination, dexterity, strength, Fine motor control, Gross motor control, decreased knowledge of use of DME, and UE functional use, cognitive skills including emotional, and psychosocial skills including coping strategies and routines and behaviors.   IMPAIRMENTS: are limiting patient from ADLs, IADLs, work, and leisure.   CO-MORBIDITIES: has no other co-morbidities that affects occupational performance. Patient will benefit from  skilled OT to address above impairments and improve overall function.  MODIFICATION OR ASSISTANCE TO COMPLETE EVALUATION: No modification of tasks or assist necessary to complete an evaluation.  OT OCCUPATIONAL PROFILE AND HISTORY: Problem focused assessment: Including review of records relating to presenting problem.  CLINICAL DECISION MAKING: Moderate - several treatment options, min-mod task modification necessary  REHAB POTENTIAL: Good  EVALUATION COMPLEXITY: Moderate    PLAN:  OT FREQUENCY: 2x/week  OT DURATION: 12 weeks  PLANNED INTERVENTIONS: self care/ADL training, therapeutic exercise, therapeutic activity, neuromuscular re-education, manual therapy, passive range of motion, moist heat, cryotherapy, contrast bath, patient/family education, coping strategies training, and DME and/or AE instructions  RECOMMENDED OTHER SERVICES: None at this time  CONSULTED AND AGREED WITH PLAN OF CARE: Patient  PLAN FOR NEXT SESSION: HEP review, neuro re-ed and therapeutic exercises  Danelle Earthly, MS, OTR/L

## 2022-12-29 ENCOUNTER — Ambulatory Visit: Payer: 59

## 2022-12-31 ENCOUNTER — Ambulatory Visit: Payer: 59 | Admitting: Physical Therapy

## 2022-12-31 ENCOUNTER — Ambulatory Visit: Payer: 59

## 2023-01-04 ENCOUNTER — Ambulatory Visit: Payer: 59

## 2023-01-06 ENCOUNTER — Ambulatory Visit: Payer: 59

## 2023-01-06 ENCOUNTER — Ambulatory Visit: Payer: 59 | Admitting: Physical Therapy

## 2023-01-11 ENCOUNTER — Ambulatory Visit: Payer: 59

## 2023-01-12 ENCOUNTER — Other Ambulatory Visit: Payer: Self-pay | Admitting: Internal Medicine

## 2023-01-12 DIAGNOSIS — R918 Other nonspecific abnormal finding of lung field: Secondary | ICD-10-CM

## 2023-01-13 ENCOUNTER — Ambulatory Visit: Payer: 59

## 2023-01-18 ENCOUNTER — Ambulatory Visit: Payer: 59

## 2023-01-20 ENCOUNTER — Ambulatory Visit: Payer: 59

## 2023-01-25 ENCOUNTER — Ambulatory Visit: Payer: 59

## 2023-01-27 ENCOUNTER — Ambulatory Visit: Payer: 59

## 2023-02-01 ENCOUNTER — Ambulatory Visit: Payer: 59

## 2023-02-03 ENCOUNTER — Ambulatory Visit: Payer: 59

## 2023-02-08 ENCOUNTER — Ambulatory Visit: Payer: 59

## 2023-02-10 ENCOUNTER — Ambulatory Visit: Payer: 59

## 2023-02-15 ENCOUNTER — Ambulatory Visit: Payer: 59

## 2023-02-17 ENCOUNTER — Ambulatory Visit: Payer: 59

## 2023-02-22 ENCOUNTER — Ambulatory Visit: Payer: 59

## 2023-02-24 ENCOUNTER — Ambulatory Visit: Payer: 59

## 2023-03-01 ENCOUNTER — Ambulatory Visit: Payer: 59

## 2023-03-03 ENCOUNTER — Ambulatory Visit: Payer: 59

## 2023-03-03 ENCOUNTER — Encounter: Payer: 59 | Admitting: Occupational Therapy

## 2023-03-05 ENCOUNTER — Ambulatory Visit
Admission: RE | Admit: 2023-03-05 | Discharge: 2023-03-05 | Disposition: A | Discharge status: Home / Self Care | Payer: 59 | Source: Ambulatory Visit | Attending: Internal Medicine | Admitting: Internal Medicine

## 2023-03-05 ENCOUNTER — Other Ambulatory Visit: Payer: 59

## 2023-03-05 DIAGNOSIS — R918 Other nonspecific abnormal finding of lung field: Secondary | ICD-10-CM

## 2023-03-08 ENCOUNTER — Ambulatory Visit: Payer: 59

## 2023-03-10 ENCOUNTER — Ambulatory Visit: Payer: 59

## 2023-03-17 ENCOUNTER — Ambulatory Visit: Payer: 59

## 2023-03-22 ENCOUNTER — Ambulatory Visit: Payer: 59

## 2023-03-24 ENCOUNTER — Ambulatory Visit: Payer: 59

## 2023-03-29 ENCOUNTER — Ambulatory Visit: Payer: 59

## 2023-03-31 ENCOUNTER — Ambulatory Visit: Payer: 59

## 2023-04-05 ENCOUNTER — Ambulatory Visit: Payer: 59

## 2023-04-07 ENCOUNTER — Ambulatory Visit: Payer: 59

## 2023-04-12 ENCOUNTER — Ambulatory Visit: Payer: 59

## 2023-04-14 ENCOUNTER — Ambulatory Visit: Payer: 59

## 2023-04-19 ENCOUNTER — Ambulatory Visit: Payer: 59

## 2023-04-21 ENCOUNTER — Ambulatory Visit: Payer: 59

## 2023-04-26 ENCOUNTER — Ambulatory Visit: Payer: 59

## 2023-04-28 ENCOUNTER — Ambulatory Visit: Payer: 59

## 2023-11-11 ENCOUNTER — Other Ambulatory Visit
Admission: RE | Admit: 2023-11-11 | Discharge: 2023-11-11 | Disposition: A | Discharge status: Home / Self Care | Source: Ambulatory Visit | Attending: Internal Medicine | Admitting: Internal Medicine

## 2023-11-11 DIAGNOSIS — R079 Chest pain, unspecified: Secondary | ICD-10-CM | POA: Insufficient documentation

## 2023-11-11 LAB — TROPONIN I (HIGH SENSITIVITY): Troponin I (High Sensitivity): 5 ng/L (ref <18)

## 2023-11-11 LAB — D-DIMER, QUANTITATIVE: D-Dimer, Quant: <0.27 ug{FEU}/mL (ref 0.00–0.50)

## 2023-12-13 ENCOUNTER — Ambulatory Visit (INDEPENDENT_AMBULATORY_CARE_PROVIDER_SITE_OTHER): Admitting: Licensed Clinical Social Worker

## 2023-12-13 DIAGNOSIS — Z634 Disappearance and death of family member: Secondary | ICD-10-CM | POA: Diagnosis not present
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
## 2023-12-20 ENCOUNTER — Ambulatory Visit: Admitting: Licensed Clinical Social Worker

## 2023-12-27 ENCOUNTER — Ambulatory Visit (INDEPENDENT_AMBULATORY_CARE_PROVIDER_SITE_OTHER): Admitting: Licensed Clinical Social Worker

## 2023-12-27 DIAGNOSIS — F320 Major depressive disorder, single episode, mild: Secondary | ICD-10-CM | POA: Diagnosis not present

## 2023-12-27 DIAGNOSIS — Z634 Disappearance and death of family member: Secondary | ICD-10-CM | POA: Diagnosis not present

## 2023-12-27 NOTE — Progress Notes (Signed)
 THERAPIST PROGRESS NOTE  Session Time: 4:01pm-4:47pm  Participation Level: Active  Behavioral Response: CasualAlertEuthymic  Type of Therapy: Individual Therapy  Treatment Goals addressed:  Goal: LTG: Reduce frequency, intensity, and duration of depression symptoms so that daily functioning is improved     Dates: Start:  12/13/23    Expected End:  04/13/24       Disciplines: Interdisciplinary, PROVIDER             Goal: LTG: Increase coping skills to manage depression and improve ability to perform daily activities     Dates: Start:  12/13/23    Expected End:  04/13/24       Disciplines: Interdisciplinary, PROVIDER             Goal: STG: Frederich Jaksch will identify cognitive patterns and beliefs that support depression     Dates: Start:  12/13/23    Expected End:  04/13/24       Disciplines: Interdisciplinary, PROVIDER             Goal: Demonstrates progress in stages of grief at own pace       ProgressTowards Goals: Progressing  Interventions: CBT, Supportive, Reframing, and Other: Grief Counseling   Summary: Eric Austin is a 63 y.o. male who presents with Change in energy/activity, Difficulty Concentrating, Fatigue, Hopelessness, Irritability, Sleep (too much or little), Tearfulness, Worthlessness. Pt was oriented times 5. Pt was cooperative and engaged. Pt denies SI/HI/AVH.    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  Patient reports when he is not busy, which is rare, he notices he always has to have background noise.  He feels he has always been this way.  Reports despite grieving, he is sleeping well.  Identifies patterns of thinking about his recently deceased wife at nighttime reporting he has not lived alone in 45 years and is feeling lonely.  Clinician assessed for self-care with patient reflecting on recent family trips and ability to engage in hobbies such as listening to his records.  Clinician educated patient on the  stages of grief and reflected on his experiences with anger and denial.  Patient reports misplace anger and guilt towards himself as he identifies desires to have spend more quality time with his wife before her death.  Utilized CBT to begin to reframe patient's misplaced guilt.  Clinician explored patient's expectations for this stage of life and complex grief related to numerous events that I have occurred in the past year such as his wife's cancer diagnosis and his stroke that have contributed to his complex grief.  Clinician and patient also explored patient's expectations for the grieving process as well as normalized the grieving experience.  Suicidal/Homicidal: Nowithout intent/plan  Therapist Response: Clinician utilized active and supportive reflection to create a safe space for patient to process recent life events.  Clinician assessed for current symptoms, stressors, safety since last session.  Educated patient on the stages of grief and reflected on his experience through stages of denial and anger.  Plan: Return again in 2 weeks.  Diagnosis: Bereavement  MDD (major depressive disorder), single episode, mild (HCC)    Collaboration of Care: [facility] psychiatrist can access notes and cln. Will review psychiatrists' notes. Check in with the patient and will see LCSW per availability. Patient agreed with treatment recommendations.   Patient/Guardian was advised Release of Information must be obtained prior to any record release in order to collaborate their care with an outside provider. Patient/Guardian  was advised if they have not already done so to contact the registration department to sign all necessary forms in order for us  to release information regarding their care.   Consent: Patient/Guardian gives verbal consent for treatment and assignment of benefits for services provided during this visit. Patient/Guardian expressed understanding and agreed to proceed.   Marvin Slot,  LCSW 12/27/2023

## 2024-01-04 ENCOUNTER — Ambulatory Visit: Admitting: Licensed Clinical Social Worker

## 2024-01-10 ENCOUNTER — Ambulatory Visit (INDEPENDENT_AMBULATORY_CARE_PROVIDER_SITE_OTHER): Admitting: Licensed Clinical Social Worker

## 2024-01-10 DIAGNOSIS — Z634 Disappearance and death of family member: Secondary | ICD-10-CM

## 2024-01-10 DIAGNOSIS — F320 Major depressive disorder, single episode, mild: Secondary | ICD-10-CM | POA: Diagnosis not present

## 2024-01-10 NOTE — Progress Notes (Signed)
 THERAPIST PROGRESS NOTE  Session Time: 2-2:52 pm  Participation Level: Active  Behavioral Response: CasualAlertEuthymic  Type of Therapy: Individual Therapy  Treatment Goals addressed:  Active     OP Depression     LTG: Reduce frequency, intensity, and duration of depression symptoms so that daily functioning is improved     Start:  12/13/23    Expected End:  04/13/24         LTG: Increase coping skills to manage depression and improve ability to perform daily activities     Start:  12/13/23    Expected End:  04/13/24         STG: [name] will identify cognitive patterns and beliefs that support depression     Start:  12/13/23    Expected End:  04/13/24         Demonstrates progress in stages of grief at own pace     Start:  12/13/23    Expected End:  04/13/24            Work with [name] to identify the major components of a recent episode of depression: physical symptoms, major thoughts and images, and major behaviors they experienced     Start:  12/13/23         [name] will identify 2 cognitive distortions they are currently using and write reframing statements to replace them     Start:  12/13/23         Coping Skills     Start:  12/13/23       Will work with the pt using CBT/DBT techniques to help the pt verbalize an understanding of the cognitive, physiological, and behavioral components of depression and its treatment. This will be done by using worksheets, interactive activities, CBT/ABC thought logs, modeling, homework, role playing and journaling. Will work with pt to learn and implement coping skills that result in a reduction of depression and improve daily functioning per pt self-report 3 out of 5 documented sessions.       Educate patient on: Stages of grief     Start:  12/13/23               ProgressTowards Goals: Progressing  Interventions: CBT, Supportive, Reframing, and Other: Grief Counseling   Summary: NICCOLAS LOEPER is a 63 y.o. male who presents with Change in energy/activity, Difficulty Concentrating, Fatigue, Hopelessness, Irritability, Sleep (too much or little), Tearfulness, Worthlessness. Pt was oriented times 5. Pt was cooperative and engaged. Pt denies SI/HI/AVH.     The patient utilized the therapeutic space to process his healing journey following his wife's unexpected death. He stated, I am doing pretty well. He reflected on a recent trip to the beach where he had a lot of alone time and managed it well. However, he became tearful while reflecting on the trip, as it was a place they frequented together.  The patient identified several ways he is coping, including listening to his records and socializing with family. The clinician reviewed the stages of grief with him. The patient reported that he is prioritizing reaching out to his wife's relatives, who remind him of her, and he reflected on their recent wedding anniversary.  The clinician praised the patient for his efforts to avoid avoiding the reality of his wife's death. He mentioned that he has made it a point to travel in honor of his late wife, addressing any regrets he had about their time spent traveling together. The patient also noted  that he is continuing to get good sleep, although some nights he struggles to fall asleep due to stressors before bed. Together, the clinician and the patient worked on problem-solving ways to alleviate these stressors after work instead of waiting until bedtime.  Suicidal/Homicidal: Nowithout intent/plan  Therapist Response: Clinician utilized active and supportive reflection to create a safe space for patient to process recent life events.  Clinician assessed for current symptoms, stressors, safety since last session.  Continue to process patient's grief through review of the stages of grief and use of coping skills.  Clinician praised patient's efforts to continue to reach out to his loved ones as well  as finding ways to honor his late wife.  Plan: Return again in 2 weeks.  Diagnosis: Bereavement  MDD (major depressive disorder), single episode, mild (HCC)   Collaboration of Care: Check in with the patient and will see LCSW per availability. Patient agreed with treatment recommendations.   Patient/Guardian was advised Release of Information must be obtained prior to any record release in order to collaborate their care with an outside provider. Patient/Guardian was advised if they have not already done so to contact the registration department to sign all necessary forms in order for us  to release information regarding their care.   Consent: Patient/Guardian gives verbal consent for treatment and assignment of benefits for services provided during this visit. Patient/Guardian expressed understanding and agreed to proceed.   Evalene KATHEE Husband, LCSW 01/10/2024

## 2024-02-02 ENCOUNTER — Ambulatory Visit (INDEPENDENT_AMBULATORY_CARE_PROVIDER_SITE_OTHER): Admitting: Licensed Clinical Social Worker

## 2024-02-02 DIAGNOSIS — F320 Major depressive disorder, single episode, mild: Secondary | ICD-10-CM | POA: Diagnosis not present

## 2024-02-02 DIAGNOSIS — Z634 Disappearance and death of family member: Secondary | ICD-10-CM

## 2024-02-02 NOTE — Progress Notes (Signed)
 THERAPIST PROGRESS NOTE  Session Time: 8:02am-8:46am  Participation Level: Active  Behavioral Response: CasualAlertEuthymic  Type of Therapy: Individual Therapy  Treatment Goals addressed:  Active     OP Depression     LTG: Reduce frequency, intensity, and duration of depression symptoms so that daily functioning is improved     Start:  12/13/23    Expected End:  04/13/24         LTG: Increase coping skills to manage depression and improve ability to perform daily activities     Start:  12/13/23    Expected End:  04/13/24         STG: [name] will identify cognitive patterns and beliefs that support depression     Start:  12/13/23    Expected End:  04/13/24         Demonstrates progress in stages of grief at own pace     Start:  12/13/23    Expected End:  04/13/24            Work with [name] to identify the major components of a recent episode of depression: physical symptoms, major thoughts and images, and major behaviors they experienced     Start:  12/13/23         [name] will identify 2 cognitive distortions they are currently using and write reframing statements to replace them     Start:  12/13/23         Coping Skills     Start:  12/13/23       Will work with the pt using CBT/DBT techniques to help the pt verbalize an understanding of the cognitive, physiological, and behavioral components of depression and its treatment. This will be done by using worksheets, interactive activities, CBT/ABC thought logs, modeling, homework, role playing and journaling. Will work with pt to learn and implement coping skills that result in a reduction of depression and improve daily functioning per pt self-report 3 out of 5 documented sessions.       Educate patient on: Stages of grief     Start:  12/13/23               ProgressTowards Goals: Progressing  Interventions: Assertiveness Training and Supportive  Summary: Eric Austin is a 63  y.o. male who presents with Change in energy/activity, Difficulty Concentrating, Fatigue, Hopelessness, Irritability, Sleep (too much or little), Tearfulness, Worthlessness. Pt was oriented times 5. Pt was cooperative and engaged. Pt denies SI/HI/AVH.    The patient attended the session and reported feeling pretty good. He reflected on several recent losses, including the death of his aunt and his family dog. Despite these losses, the patient mentioned that he is continuing to socialize and visit with his family more frequently.   During the session, the clinician assessed the patient's sleep hygiene, an area he wants to improve. However, despite previous discussions, the patient continues to report napping for over two hours in the middle of the day. He identified that his work contributes to his physical fatigue.   The clinician and the patient explored the barriers to improving his napping habits and discussed potential solutions to enhance his overall sleep health. The patient expressed difficulty in establishing healthy boundaries when it comes to helping others, stating that tasks assigned to him by others are interfering with his ability to prioritize self-care and rest.   The clinician and the patient discussed ways for him to use assertive communication to say no. The patient  set a goal to maintain Sundays as his reset day and identified conversations he can have with his network to ensure he is not burdened with others' needs on that day.    Suicidal/Homicidal: Nowithout intent/plan  Therapist Response: Clinician utilized active and supportive reflection to create a safe space for patient to process recent life events. Clinician assessed for current symptoms, stressors, safety since last session.  Continue to address ways in which patient can improve self-care by establishing healthy boundaries through use of assertive communication.  Continue to work with patient on improving sleep  hygiene.  Plan: Return again in 2 weeks.  Diagnosis: Bereavement  MDD (major depressive disorder), single episode, mild (HCC)   Collaboration of Care: AEB psychiatrist can access notes and cln. Will review psychiatrists' notes. Check in with the patient and will see LCSW per availability. Patient agreed with treatment recommendations.   Patient/Guardian was advised Release of Information must be obtained prior to any record release in order to collaborate their care with an outside provider. Patient/Guardian was advised if they have not already done so to contact the registration department to sign all necessary forms in order for us  to release information regarding their care.   Consent: Patient/Guardian gives verbal consent for treatment and assignment of benefits for services provided during this visit. Patient/Guardian expressed understanding and agreed to proceed.   Evalene KATHEE Husband, LCSW 02/02/2024

## 2024-02-14 ENCOUNTER — Ambulatory Visit (INDEPENDENT_AMBULATORY_CARE_PROVIDER_SITE_OTHER): Admitting: Licensed Clinical Social Worker

## 2024-02-14 DIAGNOSIS — F320 Major depressive disorder, single episode, mild: Secondary | ICD-10-CM

## 2024-02-14 DIAGNOSIS — Z634 Disappearance and death of family member: Secondary | ICD-10-CM | POA: Diagnosis not present

## 2024-02-14 NOTE — Progress Notes (Signed)
 THERAPIST PROGRESS NOTE  Session Time: 8:03-8:42am  Participation Level: Active  Behavioral Response: CasualAlertTearful  Type of Therapy: Individual Therapy  Treatment Goals addressed:  Active     OP Depression     LTG: Reduce frequency, intensity, and duration of depression symptoms so that daily functioning is improved     Start:  12/13/23    Expected End:  04/13/24         LTG: Increase coping skills to manage depression and improve ability to perform daily activities     Start:  12/13/23    Expected End:  04/13/24         STG: Eric Austin will identify cognitive patterns and beliefs that support depression     Start:  12/13/23    Expected End:  04/13/24         Demonstrates progress in stages of grief at own pace     Start:  12/13/23    Expected End:  04/13/24            Work with Eric Austin to identify the major components of a recent episode of depression: physical symptoms, major thoughts and images, and major behaviors they experienced     Start:  12/13/23         Eric Austin will identify 2 cognitive distortions they are currently using and write reframing statements to replace them     Start:  12/13/23         Coping Skills     Start:  12/13/23       Will work with the pt using CBT/DBT techniques to help the pt verbalize an understanding of the cognitive, physiological, and behavioral components of depression and its treatment. This will be done by using worksheets, interactive activities, CBT/ABC thought logs, modeling, homework, role playing and journaling. Will work with pt to learn and implement coping skills that result in a reduction of depression and improve daily functioning per pt self-report 3 out of 5 documented sessions.       Educate patient on: Stages of grief     Start:  12/13/23               ProgressTowards Goals: Progressing  Interventions: CBT, Supportive, Reframing, and Other: Grief Work  Summary: [name] is a 63 y.o. male who presents with Change in energy/activity, Difficulty Concentrating, Fatigue, Hopelessness, Irritability, Sleep (too much or little), Tearfulness, Worthlessness. Pt was oriented times 5. Pt was cooperative and engaged. Pt denies SI/HI/AVH.    The clinician guided the patient on the tasks of mourning and described how to adjust to loss in a healthy way. The patient became tearful, identifying that he continues to experience denial when it comes to accepting the loss both intellectually and emotionally. He reported that due to the unexpected passing of his wife, he struggles with regret, feeling they could have spent more time together if he hadn't been working as often. The clinician worked with the patient on challenging these negative cognitions. The clinician helped the patient identify ways in which he is not allowing himself to feel these emotions, rather trying to bury them. They reflected on the patient's pattern of avoidance, with the clinician encouraging him to establish healthy boundaries. They also reflected on how he is beginning to discover his own identity outside of his marriage and explored ways he can find a balance between remembering his loved one and moving forward.  For homework, the patient was asked to complete a  grief sentence completion exercise.  Suicidal/Homicidal: Nowithout intent/plan  Therapist Response: Clinician utilized active and supportive reflection to create a safe space for patient to process recent life events. Clinician assessed for current symptoms, stressors, safety since last session.  Clinician worked with patient on challenging misplaced guilt and regret regarding how he spent the final years with his wife before her death.  Clinician also encouraged patient to sit with his feelings in session as he processed through his grief.  Lastly, clinician challenged patient's pattern of avoidance while encouraging him to find ways to move  forward.  Plan: Return again in 2 weeks.  Diagnosis: Bereavement  MDD (major depressive disorder), single episode, mild (HCC)  Collaboration of Care: AEB psychiatrist can access notes and cln. Will review psychiatrists' notes. Check in with the patient and will see LCSW per availability. Patient agreed with treatment recommendations.   Patient/Guardian was advised Release of Information must be obtained prior to any record release in order to collaborate their care with an outside provider. Patient/Guardian was advised if they have not already done so to contact the registration department to sign all necessary forms in order for us  to release information regarding their care.   Consent: Patient/Guardian gives verbal consent for treatment and assignment of benefits for services provided during this visit. Patient/Guardian expressed understanding and agreed to proceed.   Eric Austin Husband, LCSW 02/14/2024

## 2024-03-02 ENCOUNTER — Ambulatory Visit (INDEPENDENT_AMBULATORY_CARE_PROVIDER_SITE_OTHER): Admitting: Licensed Clinical Social Worker

## 2024-03-02 DIAGNOSIS — Z634 Disappearance and death of family member: Secondary | ICD-10-CM

## 2024-03-02 DIAGNOSIS — F320 Major depressive disorder, single episode, mild: Secondary | ICD-10-CM

## 2024-03-02 NOTE — Progress Notes (Signed)
 THERAPIST PROGRESS NOTE  Session Time: 2:05pm-3pm  Participation Level: Active  Behavioral Response: CasualAlertEuthymic  Type of Therapy: Individual Therapy  Treatment Goals addressed:  Active     OP Depression     LTG: Reduce frequency, intensity, and duration of depression symptoms so that daily functioning is improved     Start:  12/13/23    Expected End:  04/13/24         LTG: Increase coping skills to manage depression and improve ability to perform daily activities     Start:  12/13/23    Expected End:  04/13/24         STG: [name] will identify cognitive patterns and beliefs that support depression     Start:  12/13/23    Expected End:  04/13/24         Demonstrates progress in stages of grief at own pace     Start:  12/13/23    Expected End:  04/13/24            Work with [name] to identify the major components of a recent episode of depression: physical symptoms, major thoughts and images, and major behaviors they experienced     Start:  12/13/23         [name] will identify 2 cognitive distortions they are currently using and write reframing statements to replace them     Start:  12/13/23         Coping Skills     Start:  12/13/23       Will work with the pt using CBT/DBT techniques to help the pt verbalize an understanding of the cognitive, physiological, and behavioral components of depression and its treatment. This will be done by using worksheets, interactive activities, CBT/ABC thought logs, modeling, homework, role playing and journaling. Will work with pt to learn and implement coping skills that result in a reduction of depression and improve daily functioning per pt self-report 3 out of 5 documented sessions.       Educate patient on: Stages of grief     Start:  12/13/23               ProgressTowards Goals: Progressing  Interventions: Supportive, grief work  Summary: Eric Austin is a 63 y.o. male who  presents with Change in energy/activity, Difficulty Concentrating, Fatigue, Hopelessness, Irritability, Sleep (too much or little), Tearfulness, Worthlessness. Pt was oriented times 5. Pt was cooperative and engaged. Pt denies SI/HI/AVH.    The patient used the session to process his recent experiences of grief following the passing of his dog. He also reflected on his ongoing grieving process for his wife. The patient reported that he often distracts himself and keeps busy to avoid confronting his feelings. The clinician addressed the patient's tendency to avoid processing his grief during therapy.   During the session, the patient became tearful as he identified feelings of animosity towards his wife, stemming from a history of marital challenges that left him feeling unable to trust. He reflected on regrets related to life choices he made during their marriage. The clinician provided a safe space for the patient to share his history and explore his feelings about the lack of closure he feels.  Discussed forgiveness and what the patient might need to move forward in his grieving process. The patient expressed gratitude for the opportunity to address unresolved feelings related to his past. He also indicated a desire to continue exploring these feelings of animosity in  future sessions.  Suicidal/Homicidal: Nowithout intent/plan  Therapist Response: Clinician utilized active and supportive reflection to create a safe space for patient to process recent life events. Clinician assessed for current symptoms, stressors, safety since last session.  Clinician continue to work with patient on processing grief specifically regarding his progress through the stage of anger.  Plan: Return again in 2 weeks.  Diagnosis: Bereavement  MDD (major depressive disorder), single episode, mild (HCC)   Collaboration of Care: Check in with the patient and will see LCSW per availability. Patient agreed with treatment  recommendations.   Patient/Guardian was advised Release of Information must be obtained prior to any record release in order to collaborate their care with an outside provider. Patient/Guardian was advised if they have not already done so to contact the registration department to sign all necessary forms in order for us  to release information regarding their care.   Consent: Patient/Guardian gives verbal consent for treatment and assignment of benefits for services provided during this visit. Patient/Guardian expressed understanding and agreed to proceed.   Evalene KATHEE Husband, LCSW 03/02/2024

## 2024-03-16 ENCOUNTER — Ambulatory Visit (INDEPENDENT_AMBULATORY_CARE_PROVIDER_SITE_OTHER): Admitting: Licensed Clinical Social Worker

## 2024-03-16 ENCOUNTER — Ambulatory Visit: Admitting: Licensed Clinical Social Worker

## 2024-03-16 DIAGNOSIS — F320 Major depressive disorder, single episode, mild: Secondary | ICD-10-CM

## 2024-03-16 NOTE — Progress Notes (Signed)
 THERAPIST PROGRESS NOTE  Session Time: 4:06pm-4:47am   Participation Level: Active  Behavioral Response: CasualAlertEuthymic  Type of Therapy: Individual Therapy  Treatment Goals addressed:  Active     OP Depression     LTG: Reduce frequency, intensity, and duration of depression symptoms so that daily functioning is improved     Start:  12/13/23    Expected End:  04/13/24         LTG: Increase coping skills to manage depression and improve ability to perform daily activities     Start:  12/13/23    Expected End:  04/13/24         STG: Eric Austin will identify cognitive patterns and beliefs that support depression     Start:  12/13/23    Expected End:  04/13/24         Demonstrates progress in stages of grief at own pace     Start:  12/13/23    Expected End:  04/13/24            Work with Eric Austin to identify the major components of a recent episode of depression: physical symptoms, major thoughts and images, and major behaviors they experienced     Start:  12/13/23         Eric Austin will identify 2 cognitive distortions they are currently using and write reframing statements to replace them     Start:  12/13/23         Coping Skills     Start:  12/13/23       Will work with the pt using CBT/DBT techniques to help the pt verbalize an understanding of the cognitive, physiological, and behavioral components of depression and its treatment. This will be done by using worksheets, interactive activities, CBT/ABC thought logs, modeling, homework, role playing and journaling. Will work with pt to learn and implement coping skills that result in a reduction of depression and improve daily functioning per pt self-report 3 out of 5 documented sessions.       Educate patient on: Stages of grief     Start:  12/13/23                ProgressTowards Goals: Progressing  Interventions: CBT, Supportive, and Reframing  Summary:Eric Austin is a 63 y.o.  male who presents with Change in energy/activity, Difficulty Concentrating, Fatigue, Hopelessness, Irritability, Sleep (too much or little), Tearfulness, Worthlessness. Pt was oriented times 5. Pt was cooperative and engaged. Pt denies SI/HI/AVH.    Patient recounted ways in which he is engaging in hobbies and socializing further as he works through the stages of grief.  Patient expressed excitement about upcoming trips, going to the gym, and beginning to get back into church.  Patient reports he is engaging in the self-care habits in an effort to work towards peace.  Patient experience occasional bouts of tearfulness processing guilt about life choices and time not spent with family.  Clinician challenged patient to work towards stages of acceptance and forgiveness.  Patient identified intentions for starting this new chapter as finding certain individuals who he can continue to experience life with to address feelings of loneliness.  Clinician and patient reflected on patient's struggle to trust others as a result of previous hurt from previous relationships.  Clinician worked with patient on identifying ways in which he can begin to challenge negative cognitions around trust.  Suicidal/Homicidal: Nowithout intent/plan  Therapist Response: Clinician utilized active and supportive reflection to create a safe space  for patient to process recent life events. Clinician assessed for current symptoms, stressors, safety since last session.  Worked with patient on reframing negative cognitions related to distress as a result of hurt from previous relationships.  Celebrated patient's efforts to continue to engage in self-care and expose himself to healthy support systems.  Plan: Return again in 2 weeks.  Diagnosis: MDD (major depressive disorder), single episode, mild (HCC)    Collaboration of Care: AEB psychiatrist can access notes and cln. Will review psychiatrists' notes. Check in with the patient and will  see LCSW per availability. Patient agreed with treatment recommendations.   Patient/Guardian was advised Release of Information must be obtained prior to any record release in order to collaborate their care with an outside provider. Patient/Guardian was advised if they have not already done so to contact the registration department to sign all necessary forms in order for us  to release information regarding their care.   Consent: Patient/Guardian gives verbal consent for treatment and assignment of benefits for services provided during this visit. Patient/Guardian expressed understanding and agreed to proceed.   Eric Austin Husband, LCSW 03/16/2024

## 2024-03-23 ENCOUNTER — Emergency Department

## 2024-03-23 ENCOUNTER — Encounter: Payer: Self-pay | Admitting: *Deleted

## 2024-03-23 ENCOUNTER — Other Ambulatory Visit: Payer: Self-pay

## 2024-03-23 ENCOUNTER — Emergency Department
Admission: EM | Admit: 2024-03-23 | Discharge: 2024-03-24 | Disposition: A | Discharge status: Home / Self Care | Attending: Emergency Medicine | Admitting: Emergency Medicine

## 2024-03-23 DIAGNOSIS — Z79899 Other long term (current) drug therapy: Secondary | ICD-10-CM | POA: Diagnosis not present

## 2024-03-23 DIAGNOSIS — I10 Essential (primary) hypertension: Secondary | ICD-10-CM | POA: Insufficient documentation

## 2024-03-23 DIAGNOSIS — Y92410 Unspecified street and highway as the place of occurrence of the external cause: Secondary | ICD-10-CM | POA: Diagnosis not present

## 2024-03-23 DIAGNOSIS — R0789 Other chest pain: Secondary | ICD-10-CM | POA: Insufficient documentation

## 2024-03-23 DIAGNOSIS — N179 Acute kidney failure, unspecified: Secondary | ICD-10-CM | POA: Insufficient documentation

## 2024-03-23 HISTORY — DX: Essential (primary) hypertension: I10

## 2024-03-23 HISTORY — DX: Cerebral infarction, unspecified: I63.9

## 2024-03-23 LAB — BASIC METABOLIC PANEL WITH GFR
Anion gap: 12 (ref 5–15)
BUN: 32 mg/dL — ABNORMAL HIGH (ref 8–23)
CO2: 21 mmol/L — ABNORMAL LOW (ref 22–32)
Calcium: 9.4 mg/dL (ref 8.9–10.3)
Chloride: 105 mmol/L (ref 98–111)
Creatinine, Ser: 1.74 mg/dL — ABNORMAL HIGH (ref 0.61–1.24)
GFR, Estimated: 44 mL/min — ABNORMAL LOW (ref >60)
Glucose, Bld: 98 mg/dL (ref 70–99)
Potassium: 3.6 mmol/L (ref 3.5–5.1)
Sodium: 138 mmol/L (ref 135–145)

## 2024-03-23 LAB — CBC
HCT: 42.8 % (ref 39.0–52.0)
Hemoglobin: 14.6 g/dL (ref 13.0–17.0)
MCH: 30 pg (ref 26.0–34.0)
MCHC: 34.1 g/dL (ref 30.0–36.0)
MCV: 88.1 fL (ref 80.0–100.0)
Platelets: 289 K/uL (ref 150–400)
RBC: 4.86 MIL/uL (ref 4.22–5.81)
RDW: 11.9 % (ref 11.5–15.5)
WBC: 8.4 K/uL (ref 4.0–10.5)
nRBC: 0 % (ref 0.0–0.2)

## 2024-03-23 LAB — TROPONIN I (HIGH SENSITIVITY): Troponin I (High Sensitivity): 5 ng/L (ref <18)
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
## 2024-03-24 MED ORDER — ONDANSETRON 4 MG PO TBDP
4.0000 mg | ORAL_TABLET | Freq: Once | ORAL | Status: AC
  Administered 2024-03-24: 4 mg via ORAL
  Filled 2024-03-24: qty 1

## 2024-03-24 MED ORDER — OXYCODONE-ACETAMINOPHEN 5-325 MG PO TABS
1.0000 | ORAL_TABLET | Freq: Once | ORAL | Status: AC
  Administered 2024-03-24: 1 via ORAL
  Filled 2024-03-24: qty 1

## 2024-03-24 MED ORDER — OXYCODONE-ACETAMINOPHEN 5-325 MG PO TABS: 1.0000 | ORAL_TABLET | Freq: Three times a day (TID) | ORAL | 0 refills | Status: AC | PRN

## 2024-03-24 MED ORDER — ONDANSETRON 4 MG PO TBDP: 4.0000 mg | ORAL_TABLET | Freq: Four times a day (QID) | ORAL | 0 refills | Status: AC | PRN

## 2024-03-24 NOTE — ED Notes (Signed)
 Pt discharged to ED circle at this time and left with all belongings. Pt ABCs intact. RR even and unlabored. Pt in NAD. Pt denies further needs from this RN.

## 2024-03-24 NOTE — Discharge Instructions (Addendum)
 Your kidney function was slightly elevated today with a creatinine of 1.74.  I recommend you increase your fluid intake and avoid aspirin, ibuprofen, Aleve.  Tylenol  (acetaminophen ) is safe to take.  I recommend you have your primary care provider recheck your creatinine in 1 to 2 weeks.  Your cardiac labs today were normal.  Chest x-ray showed no fractures.   You are being provided a prescription for opiates (also known as narcotics) for pain control.  Opiates can be addictive and should only be used when absolutely necessary for pain control when other alternatives do not work.  We recommend you only use them for the recommended amount of time and only as prescribed.  Please do not take with other sedative medications or alcohol.  Please do not drive, operate machinery, make important decisions while taking opiates.  Please note that these medications can be addictive and have high abuse potential.  Patients can become addicted to narcotics after only taking them for a few days.  Please keep these medications locked away from children, teenagers or any family members with history of substance abuse.  Narcotic pain medicine may also make you constipated.  You may use over-the-counter medications such as MiraLAX, Colace to prevent constipation.  If you become constipated, you may use over-the-counter enemas as needed.  Itching and nausea are also common side effects of narcotic pain medication.  If you develop uncontrolled vomiting or a rash, please stop these medications and seek medical care.
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
## 2024-04-05 ENCOUNTER — Ambulatory Visit (INDEPENDENT_AMBULATORY_CARE_PROVIDER_SITE_OTHER): Admitting: Licensed Clinical Social Worker

## 2024-04-05 DIAGNOSIS — Z634 Disappearance and death of family member: Secondary | ICD-10-CM

## 2024-04-05 DIAGNOSIS — F320 Major depressive disorder, single episode, mild: Secondary | ICD-10-CM | POA: Diagnosis not present
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
## 2024-04-18 ENCOUNTER — Ambulatory Visit: Admitting: Licensed Clinical Social Worker

## 2024-05-04 ENCOUNTER — Ambulatory Visit: Admitting: Licensed Clinical Social Worker

## 2024-05-09 ENCOUNTER — Ambulatory Visit (INDEPENDENT_AMBULATORY_CARE_PROVIDER_SITE_OTHER): Admitting: Licensed Clinical Social Worker

## 2024-05-09 DIAGNOSIS — Z634 Disappearance and death of family member: Secondary | ICD-10-CM

## 2024-05-09 DIAGNOSIS — F320 Major depressive disorder, single episode, mild: Secondary | ICD-10-CM | POA: Diagnosis not present
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
## 2024-05-31 NOTE — Progress Notes (Unsigned)
 THERAPIST PROGRESS NOTE  Session Time: 9-9:42am  Participation Level: Active  Behavioral Response: CasualAlertEuthymic  Type of Therapy: Individual Therapy  Treatment Goals addressed:  Active     OP Depression     LTG: Reduce frequency, intensity, and duration of depression symptoms so that daily functioning is improved (Completed/Met)     Start:  12/13/23    Expected End:  04/13/24    Resolved:  04/05/24    Goal Note     04/05/24: Patient reports he feels his depression is no longer interfering with day to day functioning.          LTG: Increase coping skills to manage depression and improve ability to perform daily activities (Progressing)     Start:  12/13/23    Expected End:  04/13/24         STG: [name] will identify cognitive patterns and beliefs that support depression (Completed/Met)     Start:  12/13/23    Expected End:  04/13/24    Resolved:  04/05/24    Goal Note     04/05/24: Patient reports he uses techniques to manage unhelpful thoughts.           Demonstrates progress in stages of grief at own pace (Progressing)     Start:  12/13/23    Expected End:  04/13/24          Goal Note     04/05/24: Reports he acknowledges grief is going to increase and decrease as time goes on. Identifies ways he honoring his late wife.          Work with [name] to identify the major components of a recent episode of depression: physical symptoms, major thoughts and images, and major behaviors they experienced     Start:  12/13/23         [name] will identify 2 cognitive distortions they are currently using and write reframing statements to replace them     Start:  12/13/23         Coping Skills     Start:  12/13/23       Will work with the pt using CBT/DBT techniques to help the pt verbalize an understanding of the cognitive, physiological, and behavioral components of depression and its treatment. This will be done by using worksheets,  interactive activities, CBT/ABC thought logs, modeling, homework, role playing and journaling. Will work with pt to learn and implement coping skills that result in a reduction of depression and improve daily functioning per pt self-report 3 out of 5 documented sessions.       Educate patient on: Stages of grief     Start:  12/13/23               ProgressTowards Goals: Progressing  Interventions: Strength-based, Conservator, Museum/gallery, Supportive, and Other: Grief work  Summary: Eric Austin is a 63 y.o. male who presents with trouble falling asleep, feeling tired, worrying about different things, and occasional irritability. Pt was oriented times 5. Pt was cooperative and engaged. Pt denies SI/HI/AVH.   The patient reflected on the successful establishment of boundaries to protect his personal space. He acknowledged that being alone often leads him to ruminate about his wife, but he noted progress in this area as he denies any patterns of avoidance. The patient reports that his grief is no longer interfering with his day-to-day functioning. While discussing his wife, he became tearful, but he recognized the progress he has made through the  stages of grief. He believes he has made significant advancements in moving through the stages of depression, denial, anger, and bargaining, and he feels he is approaching a state of acceptance.  Additionally, the patient continues to prioritize socializing with friends and family, as well as engaging in personal self-care activities.    Suicidal/Homicidal: Nowithout intent/plan  Therapist Response: Clinician utilized active and supportive reflection to create a safe space for patient to process recent life events. Clinician assessed for current symptoms, stressors, safety since last session.  Revisited patient's understanding of the stages of grief and his progression through each stage.  Clinician reflected on measurable progress identifying ways in  which grief is no longer impacting his day-to-day functioning.  Patient also expressed pride in his ability to engage in personal goals of self-care which she establish with clinician in previous sessions.  Plan: Return again in 3 weeks.  Diagnosis: MDD (major depressive disorder), single episode, mild  Bereavement   Collaboration of Care: Meet with LCSW per availability  Patient/Guardian was advised Release of Information must be obtained prior to any record release in order to collaborate their care with an outside provider. Patient/Guardian was advised if they have not already done so to contact the registration department to sign all necessary forms in order for us  to release information regarding their care.   Consent: Patient/Guardian gives verbal consent for treatment and assignment of benefits for services provided during this visit. Patient/Guardian expressed understanding and agreed to proceed.   Evalene KATHEE Husband, LCSW 05/31/2024

## 2024-06-01 ENCOUNTER — Ambulatory Visit (INDEPENDENT_AMBULATORY_CARE_PROVIDER_SITE_OTHER): Admitting: Licensed Clinical Social Worker

## 2024-06-01 DIAGNOSIS — F320 Major depressive disorder, single episode, mild: Secondary | ICD-10-CM | POA: Diagnosis not present

## 2024-06-01 DIAGNOSIS — Z634 Disappearance and death of family member: Secondary | ICD-10-CM

## 2024-06-28 ENCOUNTER — Ambulatory Visit: Admitting: Licensed Clinical Social Worker

## 2024-06-28 ENCOUNTER — Encounter: Payer: Self-pay | Admitting: Licensed Clinical Social Worker

## 2024-06-28 DIAGNOSIS — F3342 Major depressive disorder, recurrent, in full remission: Secondary | ICD-10-CM

## 2024-06-28 NOTE — Progress Notes (Signed)
 THERAPIST PROGRESS NOTE  Session Time: 9:02-9:53am  Participation Level: Active  Behavioral Response: CasualAlertEuthymic  Type of Therapy: Individual Therapy  Treatment Goals addressed:  Active     OP Depression     LTG: Reduce frequency, intensity, and duration of depression symptoms so that daily functioning is improved (Completed/Met)     Start:  12/13/23    Expected End:  04/13/24    Resolved:  04/05/24    Goal Note     04/05/24: Patient reports he feels his depression is no longer interfering with day to day functioning.          LTG: Increase coping skills to manage depression and improve ability to perform daily activities (Completed/Met)     Start:  12/13/23    Expected End:  04/13/24    Resolved:  06/28/24    Goal Note     06/28/24: Reports he has been using outlets such as socializing, going to the gym, traveling, and working.          STG: Joanathan Affeldt will identify cognitive patterns and beliefs that support depression (Completed/Met)     Start:  12/13/23    Expected End:  04/13/24    Resolved:  04/05/24    Goal Note     04/05/24: Patient reports he uses techniques to manage unhelpful thoughts.           Demonstrates progress in stages of grief at own pace (Completed/Met)     Start:  12/13/23    Expected End:  04/13/24    Resolved:  06/28/24       Goal Note     06/28/24: Reports improvement with no longer avoiding thinking about his wife. Shares he has been talking about her more, seeing her family, and going by her graveside.          Work with [name] to identify the major components of a recent episode of depression: physical symptoms, major thoughts and images, and major behaviors they experienced     Start:  12/13/23         [name] will identify 2 cognitive distortions they are currently using and write reframing statements to replace them     Start:  12/13/23         Coping Skills     Start:  12/13/23       Will  work with the pt using CBT/DBT techniques to help the pt verbalize an understanding of the cognitive, physiological, and behavioral components of depression and its treatment. This will be done by using worksheets, interactive activities, CBT/ABC thought logs, modeling, homework, role playing and journaling. Will work with pt to learn and implement coping skills that result in a reduction of depression and improve daily functioning per pt self-report 3 out of 5 documented sessions.       Educate patient on: Stages of grief     Start:  12/13/23               ProgressTowards Goals: Progressing  Interventions: Strength-based and Supportive  Summary: DOMANIQUE LUCKETT is a 63 y.o. male who presents with trouble falling asleep, with a hx of feeling tired, worrying about different things, and occasional irritability. Pt was oriented times 5. Pt was cooperative and engaged. Pt denies SI/HI/AVH.   The patient discussed how he has been keeping busy by reflecting on the recent holiday season following his wife's death and establishing new traditions. He mentioned changes in his living arrangements and  his efforts to take time off work. The patient also identified ways he has learned to cope with his grief, including engaging in gym activities, which have led to increased energy, an improved mood, and better social interactions. Additionally, he reflected on other efforts he is making to prioritize his health and well-being during this challenging time. The clinician and the patient explored strategies for continuing to cope with grief and discussed the importance of being mindful of avoidance patterns.   Suicidal/Homicidal: Nowithout intent/plan   Therapist Response: Clinician utilized active and supportive reflection to create a safe space for patient to process recent life events. Clinician assessed for current symptoms, stressors, safety since last session.  Reflected on patient's progress to the  stages of grief and updated patient's treatment plan based on reported progress.  Plan: Based on patient's reported progress and improvements in symptomology based on screeners, clinician and patient agreed to terminate therapeutic services.  Patient was reminded of resources should he need to resume therapeutic services and treatment plan was updated.  Diagnosis: MDD (major depressive disorder), recurrent, in full remission   Collaboration of Care: Other Meet with LCSW per availability.   Patient/Guardian was advised Release of Information must be obtained prior to any record release in order to collaborate their care with an outside provider. Patient/Guardian was advised if they have not already done so to contact the registration department to sign all necessary forms in order for us  to release information regarding their care.   Consent: Patient/Guardian gives verbal consent for treatment and assignment of benefits for services provided during this visit. Patient/Guardian expressed understanding and agreed to proceed.   Evalene KATHEE Husband, LCSW 06/28/2024
